# Patient Record
Sex: Female | Born: 1970
Health system: Southern US, Community
[De-identification: ages and names within clinical notes are randomized; demographics above are authoritative.]

## PROBLEM LIST (undated history)

## (undated) DIAGNOSIS — G43909 Migraine, unspecified, not intractable, without status migrainosus: Secondary | ICD-10-CM

## (undated) DIAGNOSIS — L309 Dermatitis, unspecified: Secondary | ICD-10-CM

## (undated) DIAGNOSIS — T7840XA Allergy, unspecified, initial encounter: Secondary | ICD-10-CM

## (undated) DIAGNOSIS — K219 Gastro-esophageal reflux disease without esophagitis: Secondary | ICD-10-CM

## (undated) DIAGNOSIS — J45909 Unspecified asthma, uncomplicated: Secondary | ICD-10-CM

## (undated) HISTORY — DX: Unspecified asthma, uncomplicated: J45.909

## (undated) HISTORY — DX: Allergy, unspecified, initial encounter: T78.40XA

## (undated) HISTORY — DX: Gastro-esophageal reflux disease without esophagitis: K21.9

## (undated) HISTORY — DX: Dermatitis, unspecified: L30.9

## (undated) HISTORY — PX: FOOT SURGERY: SHX648

## (undated) HISTORY — DX: Migraine, unspecified, not intractable, without status migrainosus: G43.909

## (undated) HISTORY — PX: HERNIA REPAIR: SHX51

## (undated) HISTORY — PX: TUBAL LIGATION: SHX77

---

## 1998-02-16 ENCOUNTER — Inpatient Hospital Stay (HOSPITAL_COMMUNITY): Admission: AD | Admit: 1998-02-16 | Discharge: 1998-02-18 | Payer: Self-pay | Admitting: Obstetrics & Gynecology

## 1998-06-03 ENCOUNTER — Other Ambulatory Visit: Admission: RE | Admit: 1998-06-03 | Discharge: 1998-06-03 | Payer: Self-pay | Admitting: Obstetrics & Gynecology

## 1999-05-10 ENCOUNTER — Other Ambulatory Visit: Admission: RE | Admit: 1999-05-10 | Discharge: 1999-05-10 | Payer: Self-pay | Admitting: Obstetrics and Gynecology

## 1999-08-23 ENCOUNTER — Other Ambulatory Visit: Admission: RE | Admit: 1999-08-23 | Discharge: 1999-08-23 | Payer: Self-pay | Admitting: Obstetrics & Gynecology

## 2000-05-29 ENCOUNTER — Other Ambulatory Visit: Admission: RE | Admit: 2000-05-29 | Discharge: 2000-05-29 | Payer: Self-pay | Admitting: *Deleted

## 2000-05-29 ENCOUNTER — Encounter (INDEPENDENT_AMBULATORY_CARE_PROVIDER_SITE_OTHER): Payer: Self-pay

## 2001-05-23 ENCOUNTER — Other Ambulatory Visit: Admission: RE | Admit: 2001-05-23 | Discharge: 2001-05-23 | Payer: Self-pay | Admitting: Obstetrics and Gynecology

## 2001-06-26 ENCOUNTER — Ambulatory Visit (HOSPITAL_COMMUNITY): Admission: RE | Admit: 2001-06-26 | Discharge: 2001-06-26 | Payer: Self-pay | Admitting: Surgery

## 2002-02-06 ENCOUNTER — Other Ambulatory Visit: Admission: RE | Admit: 2002-02-06 | Discharge: 2002-02-06 | Payer: Self-pay | Admitting: *Deleted

## 2002-04-10 ENCOUNTER — Inpatient Hospital Stay (HOSPITAL_COMMUNITY): Admission: AD | Admit: 2002-04-10 | Discharge: 2002-04-12 | Payer: Self-pay | Admitting: *Deleted

## 2002-08-15 ENCOUNTER — Other Ambulatory Visit: Admission: RE | Admit: 2002-08-15 | Discharge: 2002-08-15 | Payer: Self-pay | Admitting: *Deleted

## 2004-03-11 ENCOUNTER — Other Ambulatory Visit: Admission: RE | Admit: 2004-03-11 | Discharge: 2004-03-11 | Payer: Self-pay | Admitting: Obstetrics and Gynecology

## 2004-12-08 ENCOUNTER — Encounter: Admission: RE | Admit: 2004-12-08 | Discharge: 2004-12-08 | Payer: Self-pay | Admitting: Emergency Medicine

## 2005-05-23 ENCOUNTER — Other Ambulatory Visit: Admission: RE | Admit: 2005-05-23 | Discharge: 2005-05-23 | Payer: Self-pay | Admitting: Obstetrics and Gynecology

## 2007-02-14 ENCOUNTER — Inpatient Hospital Stay (HOSPITAL_COMMUNITY): Admission: AD | Admit: 2007-02-14 | Discharge: 2007-02-14 | Payer: Self-pay | Admitting: Family Medicine

## 2007-02-26 ENCOUNTER — Inpatient Hospital Stay (HOSPITAL_COMMUNITY): Admission: AD | Admit: 2007-02-26 | Discharge: 2007-03-01 | Payer: Self-pay | Admitting: Obstetrics and Gynecology

## 2007-02-27 ENCOUNTER — Encounter (INDEPENDENT_AMBULATORY_CARE_PROVIDER_SITE_OTHER): Payer: Self-pay | Admitting: Obstetrics and Gynecology

## 2008-07-31 ENCOUNTER — Encounter: Admission: RE | Admit: 2008-07-31 | Discharge: 2008-07-31 | Payer: Self-pay | Admitting: Family Medicine

## 2010-06-22 NOTE — Op Note (Signed)
NAME:  Stacey Hart, Stacey Hart NO.:  [id number]   MEDICAL RECORD NO.:  [id number]          PATIENT TYPE:  INP   LOCATION:  9110                          FACILITY:  [facility]   PHYSICIAN:  Osborn Coho, M.D.   DATE OF BIRTH:  15-Apr-1970   DATE OF PROCEDURE:  02/27/2007  DATE OF DISCHARGE:                               OPERATIVE REPORT   PREOPERATIVE DIAGNOSIS:  Desires permanent sterilization.   POSTOPERATIVE DIAGNOSIS:  Desires permanent sterilization.   PROCEDURE:  Postpartum bilateral tubal ligation.   ANESTHESIA:  Epidural.   ATTENDING:  Osborn Coho, M.D.   FLUIDS:  1100 mL.   URINE OUTPUT:  The patient voided prior to procedure.   ESTIMATED BLOOD LOSS:  Minimal.   COMPLICATIONS:  None.   FINDINGS:  Normal appearing bilateral fallopian tubes.   SPECIMENS TO PATHOLOGY:  Portions of bilateral fallopian tubes.   PROCEDURE IN DETAIL:  The patient was taken to the operating room after  the risks, benefits, and alternatives were reviewed with the patient.  The patient verbalized understanding and consent signed and witnessed.  The patient was given a surgical level via the epidural and prepped and  draped in a normal sterile fashion in the supine position.  A 10 mm  incision was made at the umbilicus and carried down to the underlying  layer of fascia with the Metzenbaum scissors.  The peritoneum was  entered with a hemostat.  [employer] retractors were replaced. The bowel  was noted to be fairly distended and the patient was placed in  Trendelenburg.  A narrow laparotomy sponge was placed to help move the  bowel.  The right fallopian tube was grasped in its mid portion with the  Babcock after carrying it out to its fimbriated end and was ligated  twice with 0 plain ties.  The tube was excised and the remaining pedicle  cauterized with the Bovie.  The same was done with the left fallopian  tube after carrying it out to its fimbriated end.  There was some  bleeding noted which appeared to be coming from the initial incision.  The abdomen was observed for a few minutes to see if any more bleeding  was noted and there was no more bleeding noted.  The the patient was  taken out of Trendelenburg and the fascia was repaired with 0 Vicryl in  a running  fashion.  The skin was reapproximated using 3-0 Monocryl via a  subcuticular stitch.  Sponge, lap and needle counts were correct.  The  patient tolerated the procedure well and was awaiting transfer to the  recovery room in good condition.      Osborn Coho, M.D.  Electronically Signed     AR/MEDQ  D:  02/27/2007  T:  02/27/2007  Job:  [id number]

## 2010-06-22 NOTE — H&P (Signed)
[name], Hart NO.:  [id number]   MEDICAL RECORD NO.:  [id number]          PATIENT TYPE:  INP   LOCATION:  9110                          FACILITY:  [facility]   PHYSICIAN:  Stacey Hart, M.D. DATE OF BIRTH:  August 17, 1970   DATE OF ADMISSION:  02/26/2007  DATE OF DISCHARGE:                              HISTORY & PHYSICAL   HISTORY OF PRESENT ILLNESS:  This is a 40 year old gravida 4, para 3, 0-  0-3 at 39-2/7 weeks who presents with contractions every 3 minutes and  leaking fluid since 9:00 p.m.  She reports positive fetal movement.  Her  pregnancy has been followed by the MD service and is remarkable for:  1. HSV 2.  2. Desires bilateral tubal ligation.  3. Umbilical hernia repair in 2003.  4. History of retained placenta.  5. Irritable bowel syndrome.  6. Latex sensitivity.  7. Anti-E antibody.  8. Group B strep negative.   ALLERGIES:  LATEX CAUSES BURNING AND ITCHING AND SHE IS ALSO ALLERGIC TO  PEANUTS.   OBSTETRICAL HISTORY:  Remarkable for vaginal delivery in 1996 of a  female at [redacted] weeks gestation weighing 7 pounds remarkable for a retained  placenta.  She had a vaginal delivery in 2000 of a female at [redacted] weeks  gestation weighing 7 pounds, 1 ounce with no complications.  She had a  vaginal delivery in 2004 of a female infant at [redacted] weeks gestation weighing  7 pounds, 14 ounces with no complications.   MEDICAL HISTORY:  Remarkable for a history of anemia, abnormal Pap, HSV  2, childhood varicella, varicose veins, irritable bowel syndrome.   SURGICAL HISTORY:  Remarkable for a umbilical hernia repair in 2003.   FAMILY HISTORY:  Remarkable for a grandfather with diabetes, uncle with  kidney failure, grandmother with stroke, aunt with lupus, grandfather  with pancreatic cancer, uncle with bone cancer and throat cancer.   GENETIC HISTORY:  Negative except for the patient's age of 71.   SOCIAL HISTORY:  The patient is married.  The father of the baby,  Stacey Hart, is involved and supportive.  She does not report a  religious affiliation.  She denies any alcohol, tobacco, or drug use.   PRENATAL LABORATORIES:  Hemoglobin 10.9, platelets 183,000, blood type  B+, antibody screen remarkable for anti-E antibodies, titers were 1-4 in  November and 1-8 in December, sickle cell negative, RPR nonreactive,  rubella immune, hepatitis negative, HIV negative, Pap test normal, TSH  normal.   HISTORY OF CURRENT PREGNANCY:  The patient entered care at [redacted] weeks  gestation.  She had an ultrasound to confirm dates.  First trimester  screen was normal.  Opera tive report was reviewed from her hernia  repair.  No mesh was used for repair per operative note.  An ultrasound  at 18 weeks was normal.  AFP was normal.  She had some heart  palpitations and was seen by a cardiologist with a normal examination.  She had some spotting at 24 weeks which resolved.  Glucola was normal.  Antibody-E  titer at 30 weeks was 1-4 and at 36 weeks was  1-8.  Group B  strep was negative at term.   OBJECTIVE DATA:  VITAL SIGNS:  Stable, afebrile.  HEENT: Within normal limits.  NECK:  Thyroid normal, not enlarged.  CHEST: Clear to auscultation.  HEART: Regular rate and rhythm.  ABDOMEN: Gravid, vertex, Leopold's exam shows fetal heart rate 150s with  accelerations to 160-175, minimal variability right now, and uterine  contractions every 1-3 minutes.  PELVIC:  Cervix is 4 cm, 80% effaced, -1 station with vertex  presentation.  There is blood-tinged clear fluid with positive pulling.  No HSV lesions or prodrome are present.  EXTREMITIES: Within normal  limits.   ASSESSMENT:  1. Intrauterine pregnancy at 39-2/7 weeks.  2. Active labor.  3. Spontaneous rupture of membranes.   PLAN:  1. Admit to Dr. Estanislado Hart.  2. Routine doctor orders.  3. IV Stadol and then epidural.      Stacey Hart, C.N.M.      Stacey Fat Hart, M.D.  Electronically Signed     MLW/MEDQ  D:  02/26/2007  T:  02/27/2007  Job:  [id number]

## 2010-06-25 NOTE — Op Note (Signed)
[facility]  Patient:    [name], [name] Visit Number: [id number] MRN: [id number]          Service Type: DSU Location: DAY Attending Physician:  Shelly Rubenstein Dictated by:   Abigail Miyamoto, M.D. Proc. Date: 06/26/01 Admit Date:  06/26/2001                             Operative Report  PREOPERATIVE DIAGNOSIS:  Umbilical hernia.  POSTOPERATIVE DIAGNOSIS:  Umbilical hernia.  OPERATION PERFORMED:  Umbilical hernia repair.  SURGEON:  Abigail Miyamoto, M.D.  ANESTHESIA:  LMA and 1% lidocaine and 0.25% Marcaine.  ESTIMATED BLOOD LOSS:  Minimal.  DESCRIPTION OF PROCEDURE:  Patient brought to operating room and identified as Stacey Hart.  She was placed supine on the operating table and general anesthesia was induced.  Her abdomen was then prepped and draped in the usual sterile fashion.  The skin overlying the umbilicus was then anesthetized with 1% lidocaine.  A small transverse incision was made above the umbilicus over the palpable hernia.  The incision was carried down through the small hernia defect which was easily identified.  The redundant fat coming out of the small less than 1 cm defect was transected with the electrocautery.  The fascial edges were identified circumferentially.  The fascia was then closed with several figure-of-eight 0 Ethibond sutures.  The wound was then irrigated with normal saline.  The fascia was anesthetized with 0.25% Marcaine.  The subcutaneous layer was closed with interrupted 3-0 Vicryl suture.  The skin was closed with a running 4-0 Monocryl.  Steri-Strips, gauze and tape were then applied.  The patient tolerated the procedure well.  All, sponge, needle and instrument counts were correct at the end of the procedure.  The patient was then extubated in the operating room and taken in stable condition to the recovery room. Dictated by:   Abigail Miyamoto, M.D. Attending Physician:  Shelly Rubenstein DD:  06/26/01 TD:  06/27/01 Job: [id number]

## 2010-06-25 NOTE — Discharge Summary (Signed)
[name], [name], M.D.DATE OF BIRTH:  October 20, 1970   DATE OF ADMISSION:  02/26/2007  DATE OF DISCHARGE:  03/01/2007                               DISCHARGE SUMMARY   ADMISSION DIAGNOSES:  1. Intrauterine pregnancy at 39-2/7 weeks.  2. Active labor.  3. Spontaneous rupture of membranes.   DISCHARGE DIAGNOSES:  1. Intrauterine pregnancy at term.  2. Status post vaginal delivery.  3. Status post bilateral tubal ligation.  4. Mild thrombocytopenia.  5. Breast feeding.   PROCEDURES:  1. Bilateral tubal ligation.  2. Epidural anesthesia.   HOSPITAL COURSE:  Stacey Hart is a 40 year old gravida 4, para 3-0-0-3 at  39-2/7 weeks, who presented with contractions every 3 minutes on February 26, 2007 and leakage of fluid since 9 p.m.  Her pregnancy had been  followed by MD service and was remarkable for HSV2, desiring bilateral  tubal ligation, umbilical hernia repair in 2003, history of retained  placenta, irritable bowel syndrome, latex sensitivity, anti-E antibody,  group beta strep negative.   On admission, the patient's cervix was 4 cm dilated, 80% effaced, -1  station, vertex.  She did have blood-tinged, however, clear fluid with  positive pooling.  There were no HSV lesions noted.  The patient denied  any prodromal symptoms.  She was admitted to a birthing suite.  She  received IV Stadol and epidural p.r.n.  Just after midnight on February 27, 2007, the patient was status post epidural and was comfortable.  Cervix was 5 cm, 90% vertex and -1.  Fetal heart rate was reassuring.  Approximately 1:48 in the morning on February 27, 2007, the patient was  comfortable, but was feeling some pressure.  Her cervix was 6-7, 90%, -  1.  There was some molding.  Fetal heart rate was reassuring.  Contractions were every 3-4 minutes and were mild-moderate.  Plan was  made to start pitocin per protocol IV for labor augmentation.  At 3  a.m., the patient was complete and pushed to SVD of a viable female by  the name of Lauren at 3:14 with Apgars 9 and 9 and weight 7 pounds 3  ounces.  She delivered over an intact perineum.  EBL was approximately  300 mL and placenta delivered intact spontaneously.   Later that morning which was postpartum day zero, the patient was seen  by nurse midwife.  She was doing well and was up ad lib.  Her tubal was  scheduled for 1 p.m. in the afternoon.  She was breast feeding.  She was  afebrile.  Vital signs were stable.  Fundus was firm.  She had scant  lochia.  Extremities were within normal limits.  Later on that afternoon  on postpartum day zero, the patient was taken to OR for a bilateral  tubal ligation per Dr. Osborn Coho.  The patient prior to  sterilization had been discussed risks, benefits, and alternatives of  the procedure and  did desire to proceed with bilateral tubal ligation.  The patient was sent to the recovery room in good condition following  her procedure.  By postpartum day number 1 which was also postop day  number 1, the patient was feeling okay, however, she was feeling rather  moderate gas pains status post the procedure.  She had no bowel movement  in several days, but was tolerating a regular diet and had positive  flatulence.  Her vital signs remained stable.  Her hemoglobin was 10.6  down from 11.1.  Chest was clear.  Her abdomen was soft, nontender with  the exception of some gas.  Fundus was firm and nontender.  Extremities  were within normal limits.  The patient was begun on simethicone and  Senokot that day as well as Colace b.i.d.  CBC was planned to be  repeated on March 01, 2007 secondary to low platelets.  On admission,  they were 124 and on postoperative day number 1, they were down to 107.  Postpartum day  number 2, which was also postop day number 2, the patient  was doing better, still some gas pains, but was using a heating pad and  had started the extra stool softeners, and antigas medications.  She was  ready to go home.  She had positive flatulence.  She was tolerating a  regular diet and voiding without difficulty.  She was having moderate  periumbilical pain and reported decrease in lochia.   On postpartum number 2, platelets had improved.  They were up to 128  from 107 the previous day.  White count remained stable.  It was 6.4 and  had been 7.4 the day before.  Hemoglobin was back up to 11.1 which was  what she started out with on admission.  Her physical examination was  within normal limits.  Abdomen was slightly distended, however, fundus  was firm.  Some tenderness around her umbilical incision, however, it  was intact.  She did not have any dressing or drainage noted.  It was  open to air and nontender.  She did have some generalized edema in her  lower extremities, but negative Homan's.  The patient was deemed to have  received full benefit of her hospital stay and was discharged home in  good condition.   DISCHARGE INSTRUCTIONS:  1. Per [employer].  2. Ambulation was encouraged.  3. Heating pad p.r.n. for gas pain.  4. At that time, plan was made to repeat her platelet count at 6 weeks      postpartum.   DISCHARGE MEDICATIONS:  1. Motrin 600 mg p.o. q.6 h. p.r.n. pain.  2. Darvocet-N 100 1 tablet p.o. q.4 h. p.r.n. pain.  3. The patient was also encouraged to remain on a daily vitamin and      stool softener as needed.   FOLLOW UP:  Discharge followup was to occur in 6 weeks or p.r.n.      Candice Denny Levy, [place]      Janine Limbo, M.D.  Electronically Signed    CHS/MEDQ  D:  03/03/2007  T:  03/03/2007  Job:  [id number]

## 2010-07-29 IMAGING — US US PELVIS COMPLETE
1 series · 14 of 25 positions shown · non-contrast
Comparison: None

CLINICAL DATA: Pelvic pain, menorrhagia.

TRANSABDOMINAL AND TRANSVAGINAL ULTRASOUND OF PELVIS
TECHNIQUE: Both transabdominal and transvaginal ultrasound
examinations of the pelvis were performed including evaluation of
the uterus, ovaries, adnexal regions, and pelvic cul-de-sac.

[Series 1: us pelvis complete · 0.28mm/px · 14 of 72 slices shown]
[im 1/72]
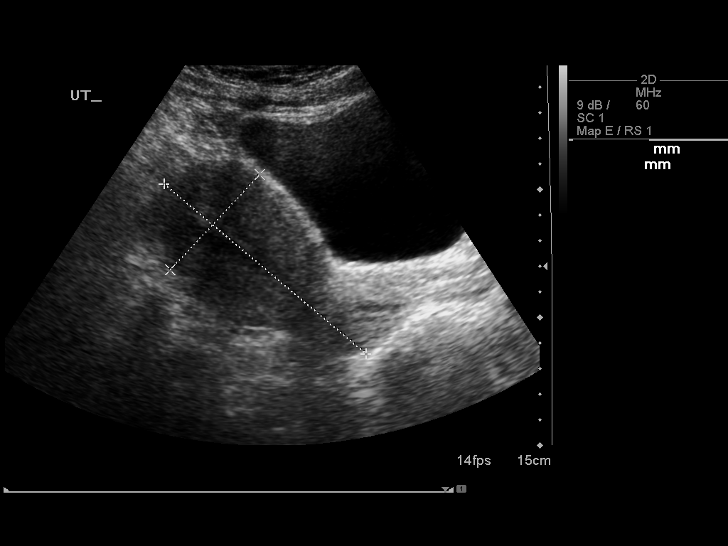
[im 6/72]
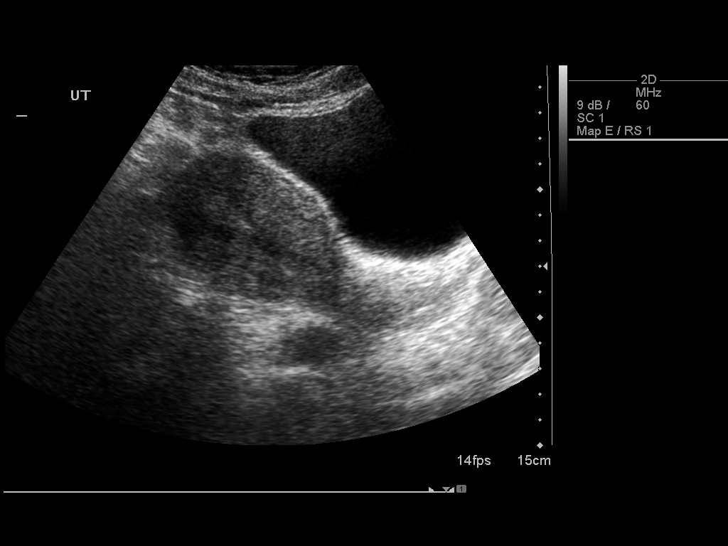
[im 12/72]
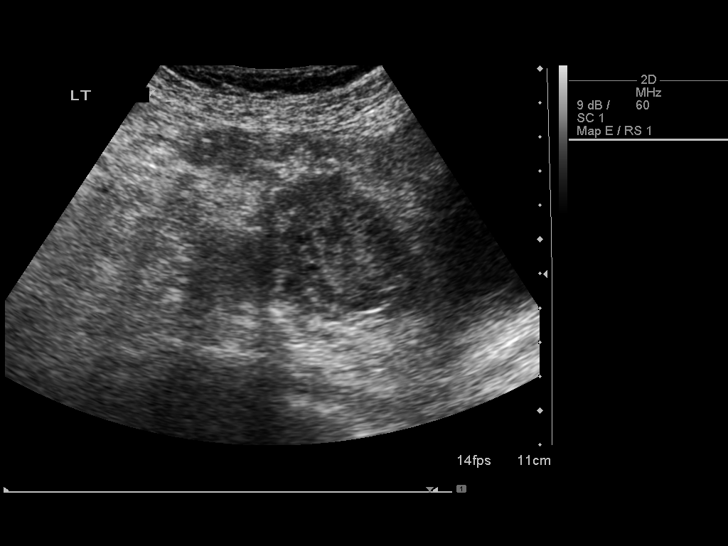
[im 18/72]
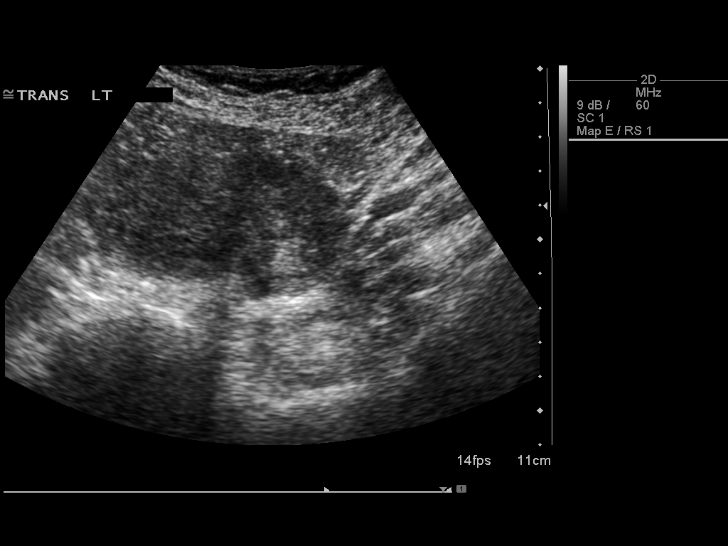
[im 24/72]
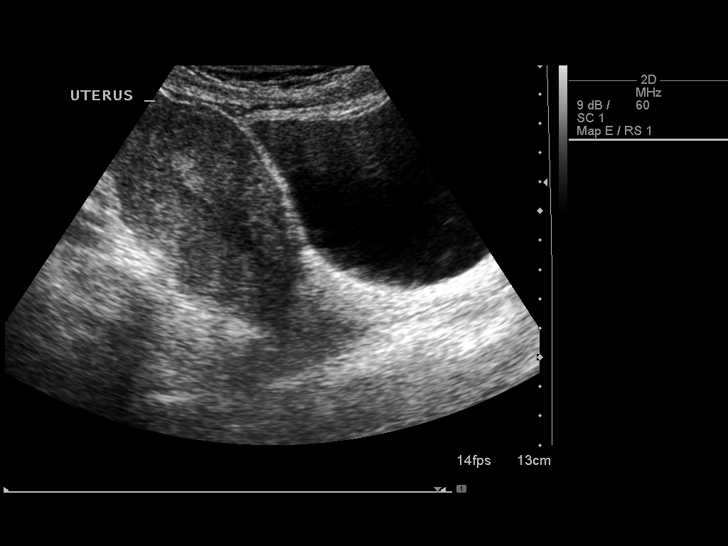
[im 27/72]
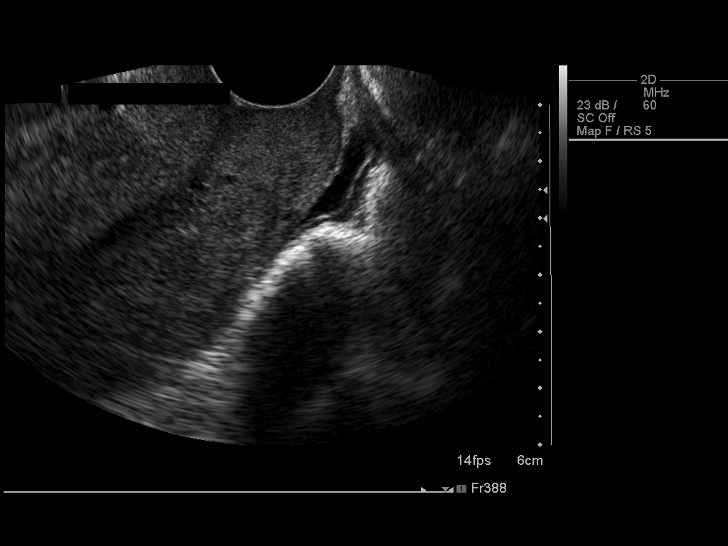
[im 33/72]
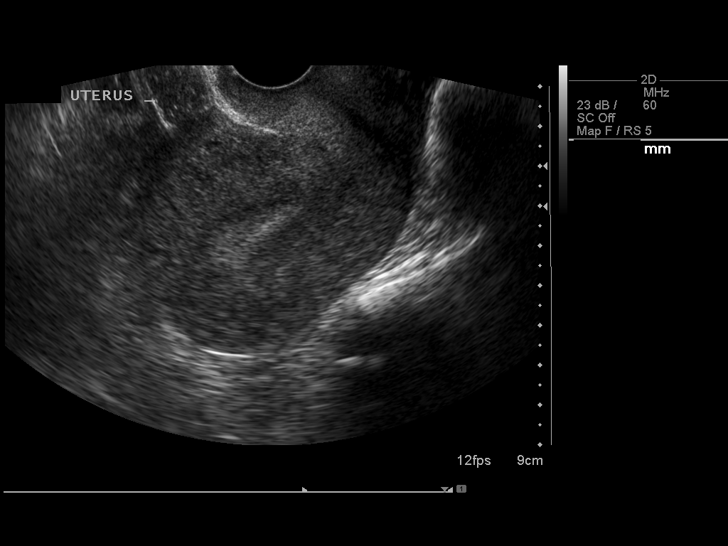
[im 39/72]
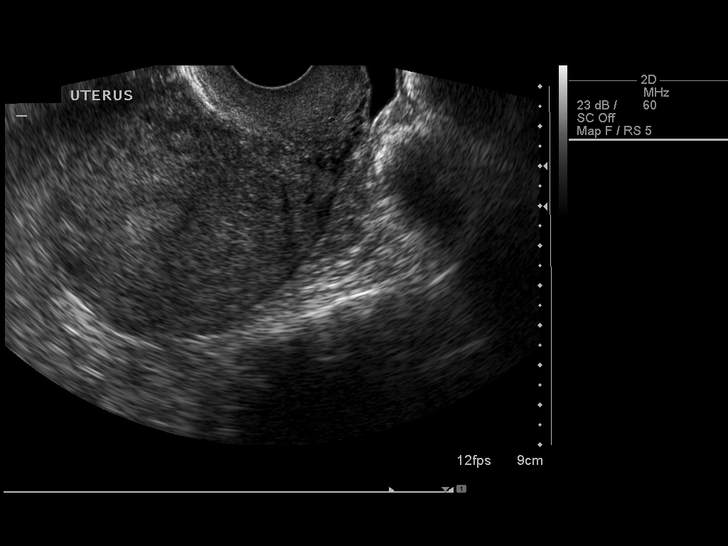
[im 45/72]
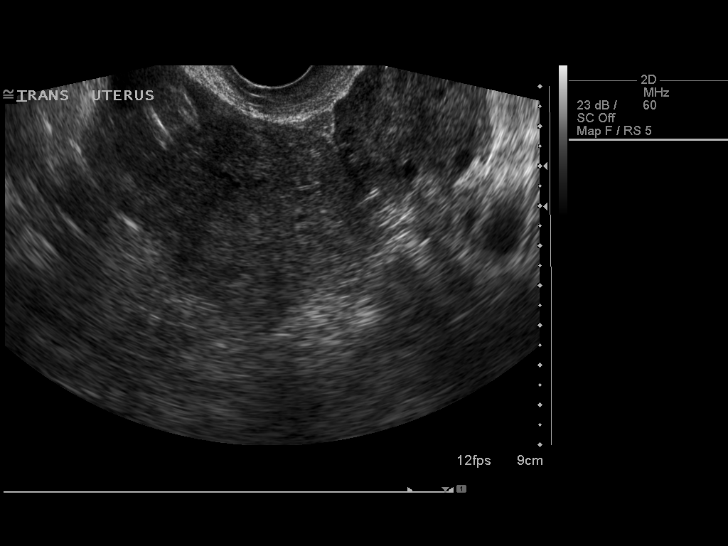
[im 48/72]
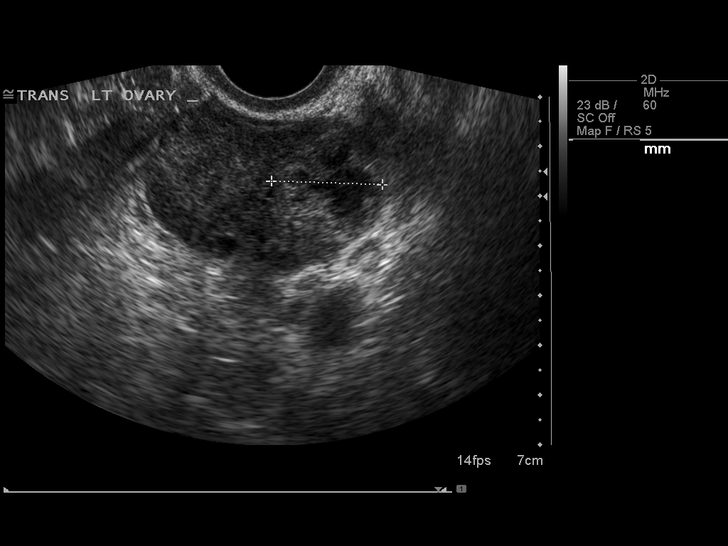
[im 54/72]
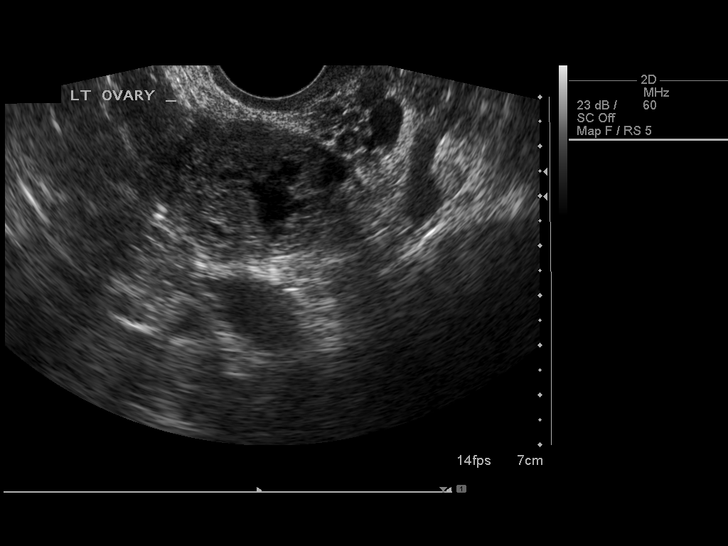
[im 60/72]
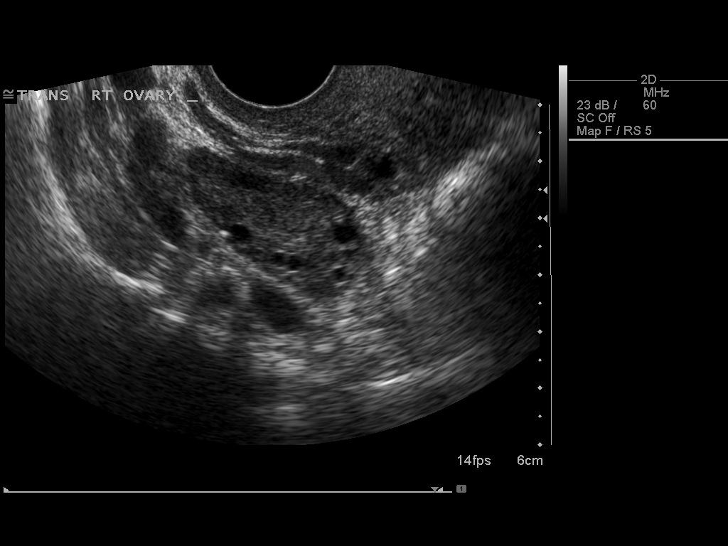
[im 66/72]
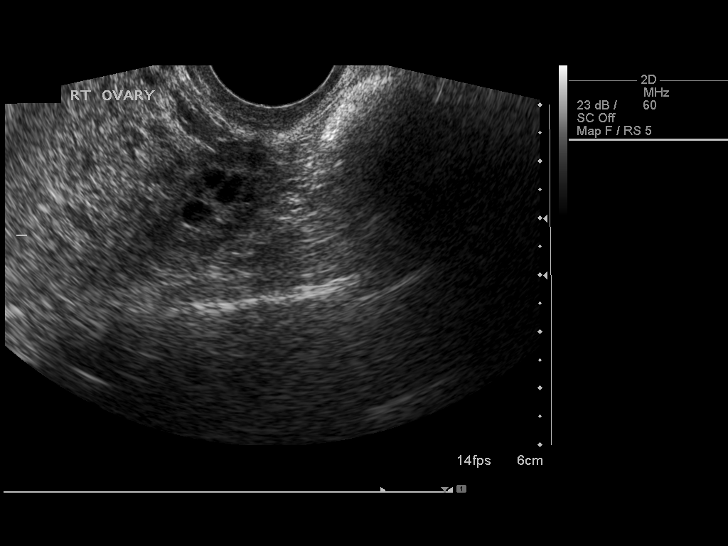
[im 72/72]
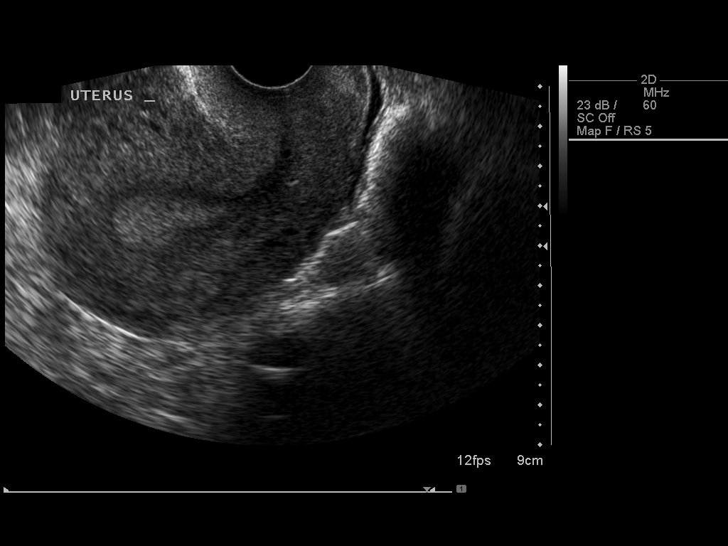

[14 of 25 positions shown; findings below may reference images not displayed]

FINDINGS: Uterus:  Uterus measures 10.3 x 5.1 x 7.0 cm.  No focal uterine
abnormality.

Endometrium:  Normal, 15 mm.

Right Ovary:  Normal size and echotexture.  No adnexal masses.
Multiple small follicles.

Left Ovary:  Small resolving corpus luteal cyst, 2.2 cm.

Other Findings:  Small amount of free fluid in the pelvis.
IMPRESSION: Small amount of free fluid, likely physiologic.

Unremarkable pelvic ultrasound.

## 2010-10-28 LAB — CBC
HCT: 31.8 — ABNORMAL LOW
HCT: 32.7 — ABNORMAL LOW
HCT: 33.5 — ABNORMAL LOW
Hemoglobin: 10.6 — ABNORMAL LOW
Hemoglobin: 11.1 — ABNORMAL LOW
Hemoglobin: 11.1 — ABNORMAL LOW
MCHC: 33.3
MCHC: 33.5
MCHC: 33.9
MCV: 85.4
MCV: 85.7
MCV: 85.9
Platelets: 107 — ABNORMAL LOW
Platelets: 124 — ABNORMAL LOW
Platelets: 128 — ABNORMAL LOW
RBC: 3.71 — ABNORMAL LOW
RBC: 3.83 — ABNORMAL LOW
RBC: 3.9
RDW: 13.6
RDW: 13.9
RDW: 14.1
WBC: 6.4
WBC: 6.4
WBC: 7.4

## 2010-10-28 LAB — RPR: RPR Ser Ql: NONREACTIVE

## 2012-05-08 DIAGNOSIS — Z0271 Encounter for disability determination: Secondary | ICD-10-CM

## 2012-07-23 ENCOUNTER — Ambulatory Visit (INDEPENDENT_AMBULATORY_CARE_PROVIDER_SITE_OTHER): Payer: 59 | Admitting: Family Medicine

## 2012-07-23 ENCOUNTER — Encounter: Payer: Self-pay | Admitting: Family Medicine

## 2012-07-23 VITALS — BP 104/69 | HR 83 | Temp 98.6°F | Resp 16 | Ht 68.5 in | Wt 179.0 lb

## 2012-07-23 DIAGNOSIS — Z0000 Encounter for general adult medical examination without abnormal findings: Secondary | ICD-10-CM

## 2012-07-23 DIAGNOSIS — R143 Flatulence: Secondary | ICD-10-CM

## 2012-07-23 DIAGNOSIS — R140 Abdominal distension (gaseous): Secondary | ICD-10-CM

## 2012-07-23 LAB — CBC WITH DIFFERENTIAL/PLATELET
Basophils Absolute: 0 10*3/uL (ref 0.0–0.1)
Basophils Relative: 1 % (ref 0–1)
Eosinophils Absolute: 0 10*3/uL (ref 0.0–0.7)
HCT: 36.9 % (ref 36.0–46.0)
Hemoglobin: 13 g/dL (ref 12.0–15.0)
Lymphocytes Relative: 35 % (ref 12–46)
Lymphs Abs: 1.4 10*3/uL (ref 0.7–4.0)
MCHC: 35.2 g/dL (ref 30.0–36.0)
MCV: 85.2 fL (ref 78.0–100.0)
Monocytes Absolute: 0.4 10*3/uL (ref 0.1–1.0)
Neutro Abs: 2.1 10*3/uL (ref 1.7–7.7)
Platelets: 260 10*3/uL (ref 150–400)
RBC: 4.33 MIL/uL (ref 3.87–5.11)
RDW: 13.1 % (ref 11.5–15.5)
WBC: 4 10*3/uL (ref 4.0–10.5)

## 2012-07-23 NOTE — Patient Instructions (Signed)
Food diary to see if identifiable cause of bloating. recheck in next 1-2 months if not improving. Return to the clinic or go to the nearest emergency room if any of your symptoms worsen or new symptoms occur. We will refer you for a mammogram.  You should receive a call or letter about your lab results within the next week to 10 days.  Plan on recheck labia cyst and pap testing at your convenience.  \Keeping You Healthy  Get These Tests 1. Blood Pressure- Have your blood pressure checked once a year by your health care provider.  Normal blood pressure is 120/80. 2. Weight- Have your body mass index (BMI) calculated to screen for obesity.  BMI is measure of body fat based on height and weight.  You can also calculate your own BMI at https://www.west-esparza.com/. 3. Cholesterol- Have your cholesterol checked every 5 years starting at age 56 then yearly starting at age 60. 4. Chlamydia, HIV, and other sexually transmitted diseases- Get screened every year until age 58, then within three months of each new sexual provider. 5. Pap Smear- Every 1-3 years; discuss with your health care provider. 6. Mammogram- Every year starting at age 40  Take these medicines  Calcium with Vitamin D-Your body needs 1200 mg of Calcium each day and [id number] IU of Vitamin D daily.  Your body can only absorb 500 mg of Calcium at a time so Calcium must be taken in 2 or 3 divided doses throughout the day.  Multivitamin with folic acid- Once daily if it is possible for you to become pregnant.  Get these Immunizations  Gardasil-Series of three doses; prevents HPV related illness such as genital warts and cervical cancer.  Menactra-Single dose; prevents meningitis.  Tetanus shot- Every 10 years.  Flu shot-Every year.  Take these steps 1. Do not smoke-Your healthcare provider can help you quit.  For tips on how to quit go to www.smokefree.gov or call 1-800 QUITNOW. 2. Be physically active- Exercise 5 days a week for at  least 30 minutes.  If you are not already physically active, start slow and gradually work up to 30 minutes of moderate physical activity.  Examples of moderate activity include walking briskly, dancing, swimming, bicycling, etc. 3. Breast Cancer- A self breast exam every month is important for early detection of breast cancer.  For more information and instruction on self breast exams, ask your healthcare provider or [place]. 4. Eat a healthy diet- Eat a variety of healthy foods such as fruits, vegetables, whole grains, low fat milk, low fat cheeses, yogurt, lean meats, poultry and fish, beans, nuts, tofu, etc.  For more information go to www. Thenutritionsource.org 5. Drink alcohol in moderation- Limit alcohol intake to one drink or less per day. Never drink and drive. 6. Depression- Your emotional health is as important as your physical health.  If you're feeling down or losing interest in things you normally enjoy please talk to your healthcare provider about being screened for depression. 7. Dental visit- Brush and floss your teeth twice daily; visit your dentist twice a year. 8. Eye doctor- Get an eye exam at least every 2 years. 9. Helmet use- Always wear a helmet when riding a bicycle, motorcycle, rollerblading or skateboarding. 10. Safe sex- If you may be exposed to sexually transmitted infections, use a condom. 11. Seat belts- Seat belts can save your live; always wear one. 12. Smoke/Carbon Monoxide detectors- These detectors need to be installed on the appropriate level of your home. Replace batteries  at least once a year. 13. Skin cancer- When out in the sun please cover up and use sunscreen 15 SPF or higher. 14. Violence- If anyone is threatening or hurting you, please tell your healthcare provider.

## 2012-07-23 NOTE — Progress Notes (Signed)
Subjective:    Patient ID: Stacey Hart, female    DOB: May 04, 1970, 42 y.o.   MRN: [id number]  HPI Stacey Hart is a 42 y.o. female CPE - No hx of mammogram.  No FH of breast cancer.  Lat pap smear - July 2012. Normal then, and normal prior to that. Denies sti's in past. Married and no new partners.  Would like sti testing - plans on chlamydia and gonorrhea testing when has pap testing.  Fasting today since last night.  Last tdap in 2011.   Has regular optho and dentist follow up.   Still has labial cyst - no change is size from 2012.  Plans to rtc as on menses today.  Declined pap today.   Bloating at times. Notes most days, past 6 months.  No abd pain. No N/v. Min belching/flatus - no change in amount. No change in pant size, and improves as day goes on. BM QD, no diarrhea. Off and on constipation. No current fiber supplements. Gas and cramping with dairy at times.    Review of Systems  Gastrointestinal: Positive for constipation and abdominal distention (bloating as above. ). Negative for nausea, vomiting, blood in stool and anal bleeding.  13 point review of systems per patient health survey noted.  Negative other than as indicated on reviewed nursing note.      Objective:   Physical Exam  Vitals reviewed. Constitutional: She is oriented to person, place, and time. She appears well-developed and well-nourished.  HENT:  Head: Normocephalic and atraumatic.  Right Ear: External ear normal.  Left Ear: External ear normal.  Mouth/Throat: Oropharynx is clear and moist.  Eyes: Conjunctivae are normal. Pupils are equal, round, and reactive to light.  Neck: Normal range of motion. Neck supple. No thyromegaly present.  Cardiovascular: Normal rate, regular rhythm, normal heart sounds and intact distal pulses.   No murmur heard. Pulmonary/Chest: Effort normal and breath sounds normal. No respiratory distress. She has no wheezes. Right breast exhibits no inverted nipple, no  mass, no nipple discharge, no skin change and no tenderness. Left breast exhibits no inverted nipple, no mass, no nipple discharge, no skin change and no tenderness. Breasts are symmetrical.  Abdominal: Soft. Bowel sounds are normal. There is no tenderness.  Genitourinary:  Exam deferred today.   Musculoskeletal: Normal range of motion. She exhibits no edema and no tenderness.  Lymphadenopathy:    She has no cervical adenopathy.  Neurological: She is alert and oriented to person, place, and time.  Skin: Skin is warm and dry. No rash noted.  Psychiatric: She has a normal mood and affect. Her behavior is normal. Thought content normal.       Assessment & Plan:   Stacey Hart is a 42 y.o. female  PE (physical exam), annual - Plan: HIV antibody, RPR, Hepatitis B surface antigen, Hepatitis B surface antibody, Comprehensive metabolic panel, Lipid panel, TSH, CBC with Differential, MM Digital Screening referral. Anticipatory guidance below. check labs above. Plans on rtc to recheck cystic area on labia that has been reportedly stable. Can check PAP3 at that time as well.   Abdominal bloating - intermittent. NT on exam, no palpable swelling or distension appreciated.  Trial of increase water and slowly adapt increase in fiber. Food diary to determine if pattern.  ? Gluten vs dairy with cheese.  If not improving next 2 months - rtc for recheck, including pelvic with bimanual exam.   Patient Instructions  Food diary to see if identifiable cause of bloating.  recheck in next 1-2 months if not improving. Return to the clinic or go to the nearest emergency room if any of your symptoms worsen or new symptoms occur. We will refer you for a mammogram.  You should receive a call or letter about your lab results within the next week to 10 days.  Plan on recheck labia cyst and pap testing at your convenience.  \Keeping You Healthy  Get These Tests 1. Blood Pressure- Have your blood pressure checked  once a year by your health care provider.  Normal blood pressure is 120/80. 2. Weight- Have your body mass index (BMI) calculated to screen for obesity.  BMI is measure of body fat based on height and weight.  You can also calculate your own BMI at https://www.west-esparza.com/. 3. Cholesterol- Have your cholesterol checked every 5 years starting at age 17 then yearly starting at age 45. 4. Chlamydia, HIV, and other sexually transmitted diseases- Get screened every year until age 21, then within three months of each new sexual provider. 5. Pap Smear- Every 1-3 years; discuss with your health care provider. 6. Mammogram- Every year starting at age 60  Take these medicines  Calcium with Vitamin D-Your body needs 1200 mg of Calcium each day and [phone] IU of Vitamin D daily.  Your body can only absorb 500 mg of Calcium at a time so Calcium must be taken in 2 or 3 divided doses throughout the day.  Multivitamin with folic acid- Once daily if it is possible for you to become pregnant.  Get these Immunizations  Gardasil-Series of three doses; prevents HPV related illness such as genital warts and cervical cancer.  Menactra-Single dose; prevents meningitis.  Tetanus shot- Every 10 years.  Flu shot-Every year.  Take these steps 1. Do not smoke-Your healthcare provider can help you quit.  For tips on how to quit go to www.smokefree.gov or call 1-800 QUITNOW. 2. Be physically active- Exercise 5 days a week for at least 30 minutes.  If you are not already physically active, start slow and gradually work up to 30 minutes of moderate physical activity.  Examples of moderate activity include walking briskly, dancing, swimming, bicycling, etc. 3. Breast Cancer- A self breast exam every month is important for early detection of breast cancer.  For more information and instruction on self breast exams, ask your healthcare provider or [place]. 4. Eat a healthy diet- Eat a  variety of healthy foods such as fruits, vegetables, whole grains, low fat milk, low fat cheeses, yogurt, lean meats, poultry and fish, beans, nuts, tofu, etc.  For more information go to www. Thenutritionsource.org 5. Drink alcohol in moderation- Limit alcohol intake to one drink or less per day. Never drink and drive. 6. Depression- Your emotional health is as important as your physical health.  If you're feeling down or losing interest in things you normally enjoy please talk to your healthcare provider about being screened for depression. 7. Dental visit- Brush and floss your teeth twice daily; visit your dentist twice a year. 8. Eye doctor- Get an eye exam at least every 2 years. 9. Helmet use- Always wear a helmet when riding a bicycle, motorcycle, rollerblading or skateboarding. 10. Safe sex- If you may be exposed to sexually transmitted infections, use a condom. 11. Seat belts- Seat belts can save your live; always wear one. 12. Smoke/Carbon Monoxide detectors- These detectors need to be installed on the appropriate level of your home. Replace batteries at least once a year. 13. Skin cancer-  When out in the sun please cover up and use sunscreen 15 SPF or higher. 14. Violence- If anyone is threatening or hurting you, please tell your healthcare provider.

## 2012-07-24 LAB — COMPREHENSIVE METABOLIC PANEL
ALT: 14 U/L (ref 0–35)
AST: 15 U/L (ref 0–37)
Albumin: 4.5 g/dL (ref 3.5–5.2)
BUN: 14 mg/dL (ref 6–23)
CO2: 25 mEq/L (ref 19–32)
Calcium: 9.8 mg/dL (ref 8.4–10.5)
Chloride: 103 mEq/L (ref 96–112)
Creat: 0.65 mg/dL (ref 0.50–1.10)
Glucose, Bld: 87 mg/dL (ref 70–99)
Potassium: 4.3 mEq/L (ref 3.5–5.3)
Sodium: 137 mEq/L (ref 135–145)
Total Bilirubin: 0.6 mg/dL (ref 0.3–1.2)
Total Protein: 7.3 g/dL (ref 6.0–8.3)

## 2012-07-24 LAB — TSH: TSH: 0.733 u[IU]/mL (ref 0.350–4.500)

## 2012-07-24 LAB — LIPID PANEL
Cholesterol: 163 mg/dL (ref 0–200)
HDL: 48 mg/dL (ref >39)
LDL Cholesterol: 100 mg/dL — ABNORMAL HIGH (ref 0–99)
Total CHOL/HDL Ratio: 3.4 Ratio
VLDL: 15 mg/dL (ref 0–40)

## 2012-07-24 LAB — HEPATITIS B SURFACE ANTIGEN: Hepatitis B Surface Ag: NEGATIVE

## 2012-07-24 LAB — HEPATITIS B SURFACE ANTIBODY, QUANTITATIVE: Hep B S AB Quant (Post): 0 m[IU]/mL

## 2012-08-20 ENCOUNTER — Ambulatory Visit
Admission: RE | Admit: 2012-08-20 | Discharge: 2012-08-20 | Disposition: A | Discharge status: Home / Self Care | Payer: 59 | Source: Ambulatory Visit | Attending: Family Medicine | Admitting: Family Medicine

## 2012-08-20 ENCOUNTER — Encounter: Payer: Self-pay | Admitting: Family Medicine

## 2012-08-20 ENCOUNTER — Ambulatory Visit (INDEPENDENT_AMBULATORY_CARE_PROVIDER_SITE_OTHER): Payer: 59 | Admitting: Family Medicine

## 2012-08-20 VITALS — BP 116/74 | HR 89 | Temp 98.6°F | Resp 16 | Ht 68.5 in | Wt 183.5 lb

## 2012-08-20 DIAGNOSIS — N750 Cyst of Bartholin's gland: Secondary | ICD-10-CM

## 2012-08-20 DIAGNOSIS — Z0000 Encounter for general adult medical examination without abnormal findings: Secondary | ICD-10-CM

## 2012-08-20 DIAGNOSIS — Z9189 Other specified personal risk factors, not elsewhere classified: Secondary | ICD-10-CM

## 2012-08-20 NOTE — Progress Notes (Signed)
  Subjective:    Patient ID: Stacey Hart, female    DOB: 1970-03-05, 42 y.o.   MRN: [id number]  HPI Stacey Hart is a 42 y.o. female  See last CPE.  Hx of cystic area on labia in past.  Diagnosed in 2012 - suspected bartholin/labial cyst.  Last few weeks feels like only a little larger, but no pain - just a little larger. No change in past with warm soaks. No f/c, no discharge. Last pap normal July 2012 - normal. Last abnormal pap 10 years ago - colpo for ascus pap. No known CKC or LEEP.  currently on menses day 3 of usual 5 days.  Scant bleeding currently.  Had mammogram this am.   Review of Systems  Constitutional: Negative for fever and chills.  Genitourinary: Negative for genital sores, vaginal pain and menstrual problem.       Objective:   Physical Exam  Vitals reviewed. Constitutional: She is oriented to person, place, and time. She appears well-developed and well-nourished.  Pulmonary/Chest: Effort normal and breath sounds normal.  Abdominal: There is no tenderness.  Genitourinary:    There is no rash, tenderness, lesion or injury on the right labia. There is tenderness and lesion on the left labia. There is no rash or injury on the left labia. There is bleeding (cleared cervix with fox swab x 3 prior to pap testing without difficulty. ) around the vagina.  Neurological: She is alert and oriented to person, place, and time.  Skin: Skin is warm and dry.  Psychiatric: She has a normal mood and affect. Her behavior is normal.      Assessment & Plan:  Stacey Hart is a 42 y.o. female Pap smear, high-risk (screening, no prior abnormality) - Plan: Pap IG, CT/NG w/ reflex HPV when ASC-U. pap3 sent.   Bartholin cyst - Plan: Ambulatory referral to Gynecology given age of 29 to determine if cytology needed, or simple i/d given chronicity. Discussed does not appear to be abscess at present, but rtc precautions discussed.  Patient Instructions  We will refer you to  gynecologist for drainage or eval of the Bartholin's cyst.  You should receive a call or letter about your lab results within the next week to 10 days.

## 2012-08-20 NOTE — Patient Instructions (Signed)
We will refer you to gynecologist for drainage or eval of the Bartholin's cyst.  You should receive a call or letter about your lab results within the next week to 10 days.

## 2012-08-21 LAB — PAP IG, CT-NG, RFX HPV ASCU
Chlamydia Probe Amp: NEGATIVE
GC Probe Amp: NEGATIVE

## 2012-10-25 ENCOUNTER — Other Ambulatory Visit: Payer: Self-pay | Admitting: Obstetrics & Gynecology

## 2013-09-27 ENCOUNTER — Other Ambulatory Visit: Payer: Self-pay

## 2013-09-27 DIAGNOSIS — Z1231 Encounter for screening mammogram for malignant neoplasm of breast: Secondary | ICD-10-CM

## 2013-10-02 ENCOUNTER — Encounter (INDEPENDENT_AMBULATORY_CARE_PROVIDER_SITE_OTHER): Payer: Self-pay

## 2013-10-02 ENCOUNTER — Ambulatory Visit
Admission: RE | Admit: 2013-10-02 | Discharge: 2013-10-02 | Disposition: A | Discharge status: Home / Self Care | Payer: 59 | Source: Ambulatory Visit

## 2013-10-02 DIAGNOSIS — Z1231 Encounter for screening mammogram for malignant neoplasm of breast: Secondary | ICD-10-CM

## 2014-05-22 ENCOUNTER — Encounter: Payer: Self-pay | Admitting: Physician Assistant

## 2014-05-22 ENCOUNTER — Ambulatory Visit (INDEPENDENT_AMBULATORY_CARE_PROVIDER_SITE_OTHER): Payer: 59 | Admitting: Physician Assistant

## 2014-05-22 VITALS — BP 103/70 | HR 112 | Temp 98.4°F | Resp 16 | Ht 68.5 in | Wt 175.8 lb

## 2014-05-22 DIAGNOSIS — R200 Anesthesia of skin: Secondary | ICD-10-CM

## 2014-05-22 DIAGNOSIS — R208 Other disturbances of skin sensation: Secondary | ICD-10-CM

## 2014-05-22 DIAGNOSIS — Z130 Encounter for screening for diseases of the blood and blood-forming organs and certain disorders involving the immune mechanism: Secondary | ICD-10-CM | POA: Diagnosis not present

## 2014-05-22 DIAGNOSIS — Z1239 Encounter for other screening for malignant neoplasm of breast: Secondary | ICD-10-CM

## 2014-05-22 DIAGNOSIS — Z0000 Encounter for general adult medical examination without abnormal findings: Secondary | ICD-10-CM

## 2014-05-22 DIAGNOSIS — Z13220 Encounter for screening for lipoid disorders: Secondary | ICD-10-CM | POA: Diagnosis not present

## 2014-05-22 DIAGNOSIS — Z131 Encounter for screening for diabetes mellitus: Secondary | ICD-10-CM

## 2014-05-22 DIAGNOSIS — Z1329 Encounter for screening for other suspected endocrine disorder: Secondary | ICD-10-CM

## 2014-05-22 LAB — POCT GLYCOSYLATED HEMOGLOBIN (HGB A1C): Hemoglobin A1C: 5.3

## 2014-05-22 LAB — GLUCOSE, POCT (MANUAL RESULT ENTRY): POC Glucose: 90 mg/dl (ref 70–99)

## 2014-05-22 NOTE — Progress Notes (Signed)
   Subjective:    Patient ID: Stacey Hart, female    DOB: 1970-06-24, 44 y.o.   MRN: [id number]  HPI    Review of Systems  Constitutional: Negative.   HENT: Positive for postnasal drip, rhinorrhea, sinus pressure and sneezing.   Eyes: Positive for itching.  Respiratory: Negative.   Cardiovascular: Negative.   Gastrointestinal: Negative.   Endocrine: Negative.   Genitourinary: Negative.   Musculoskeletal: Negative.   Skin: Negative.   Allergic/Immunologic: Positive for environmental allergies.  Neurological: Negative.   Hematological: Negative.   Psychiatric/Behavioral: Negative.        Objective:   Physical Exam        Assessment & Plan:

## 2014-05-22 NOTE — Progress Notes (Signed)
Subjective:    Patient ID: Stacey Hart, female    DOB: 02-28-1970, 44 y.o.   MRN: [id number]  HPI Patient presents for complete physical with complaint of left 5th toe numbness. Also adds that she is now seeing a cardiologist with echo scheduled this month for cough and increased dizziness over the past 2 months related to the mitral valve prolapse that developed 7 years ago (2009) while she was pregnant. Denies current dizziness, HA, SOB, CP, cough, or edema. Has not been told she is experiencing heart failure.   Had bunionectomy of 5th toe in 2000 and has not had any problems up until 3 months ago. Has gradually experienced more numbness of toe, but denies pain, swelling, weakness, or color change. Feels like pins and needles or like toe has "fallen asleep". Numbness is worse in the morning, but feels better with rubbing of toe. Numbness does not radiate. Told she was pre-diabetic previously, but never on any medications for. States that she has improved diet, but is not dieting and has not lost any weight. Has been walking more often for exercise, but denies trauma, falls, or injury to foot/toe. Numbness does not affect gait.  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 States that she does not have a stressful life and is happy. Lives with husband and children. Has never smoked or used illicit drugs. Drinks alcohol socially. Not allergic to any medications, however is allergic to latex. Has seasonal allergies that are currently affecting her with sx of itchy eyes, sneezing, rhinorrhea, and sinus pressure.  Health maintence: Eye exam complete 2015. UTD on immunizations. Pap 2014 was normal. Mammogram 2015 was normal and is at higher risk as mother had breast cancer.   Review of Systems  Constitutional: Negative for fever, diaphoresis, activity change, appetite change and fatigue.  HENT: Positive for  congestion, postnasal drip, rhinorrhea, sinus pressure and sneezing. Negative for ear pain and sore throat.   Eyes: Positive for itching. Negative for photophobia, discharge and visual disturbance.  Respiratory: Negative.  Negative for cough, chest tightness, shortness of breath, wheezing and stridor.   Cardiovascular: Negative.  Negative for chest pain, palpitations and leg swelling.  Gastrointestinal: Negative.  Negative for nausea, vomiting, abdominal pain, diarrhea, constipation and blood in stool.  Genitourinary: Negative.  Negative for dysuria, frequency, hematuria, flank pain, vaginal bleeding, vaginal discharge, menstrual problem and dyspareunia.  Musculoskeletal: Negative for myalgias, joint swelling, arthralgias and gait problem.  Skin: Negative.  Negative for color change.  Allergic/Immunologic: Positive for environmental allergies.  Neurological: Positive for numbness. Negative for dizziness, weakness, light-headedness and headaches.  Hematological: Negative for adenopathy.  Psychiatric/Behavioral: Negative for behavioral problems, sleep disturbance, dysphoric mood and decreased concentration. The patient is not nervous/anxious.       Objective:   Physical Exam  Constitutional: She is oriented to person, place, and time. She appears well-developed and well-nourished. No distress.  Blood pressure 103/70, pulse 112, temperature 98.4 F (36.9 C), temperature source Oral, resp. rate 16, height 5' 8.5" (1.74 m), weight 175 lb 12.8 oz (79.742 kg), last menstrual period 05/09/2014, SpO2 98 %.  HENT:  Head: Normocephalic and atraumatic.  Right Ear: External ear normal.  Left Ear: External ear normal.  Nose: Nose normal.  Mouth/Throat: Oropharynx is clear and moist. No oropharyngeal exudate.  Eyes: Conjunctivae and EOM are normal. Pupils are equal, round, and reactive to light. Right eye exhibits  no discharge. Left eye exhibits no discharge. No scleral icterus.  Neck: Normal range of  motion. Neck supple. No JVD present. No thyromegaly present.  Cardiovascular: Normal rate, regular rhythm and intact distal pulses.  Exam reveals no gallop and no friction rub.   Murmur heard. Pulmonary/Chest: Effort normal and breath sounds normal. No respiratory distress. She has no wheezes. She has no rales. She exhibits no tenderness.  Abdominal: Soft. Bowel sounds are normal. She exhibits no distension and no mass. There is no tenderness. There is no rebound and no guarding.  Musculoskeletal: Normal range of motion. She exhibits no edema or tenderness.       Left foot: There is normal range of motion, no tenderness, no bony tenderness, no swelling, normal capillary refill, no crepitus and no deformity.       Feet:  Lymphadenopathy:    She has no cervical adenopathy.  Neurological: She is alert and oriented to person, place, and time. She has normal reflexes. She displays no atrophy. No cranial nerve deficit or sensory deficit. She exhibits normal muscle tone. Coordination normal.  Skin: Skin is warm and dry. No rash noted. She is not diaphoretic. No erythema. No pallor.  Psychiatric: Her behavior is normal. Judgment and thought content normal.   Results for orders placed or performed in visit on 05/22/14  POCT glucose (manual entry)  Result Value Ref Range   POC Glucose 90 70 - 99 mg/dl  POCT glycosylated hemoglobin (Hb A1C)  Result Value Ref Range   Hemoglobin A1C 5.3    Wt Readings from Last 3 Encounters:  05/22/14 175 lb 12.8 oz (79.742 kg)  08/20/12 183 lb 8 oz (83.235 kg)  07/23/12 179 lb (81.194 kg)      Assessment & Plan:  1. Annual physical exam Life style modifications discussed. Specifically surrounding diet.  2. Numbness of toes Likely related to previous surgery. If pain develops can send to either ortho or neuro. - POCT glucose (manual entry) - POCT glycosylated hemoglobin (Hb A1C)  3. Screening for cholesterol level - Lipid panel  4. Screening for diabetes  mellitus - POCT glycosylated hemoglobin (Hb A1C) - Comprehensive metabolic panel  5. Screening for deficiency anemia - CBC  6. Screening for thyroid disorder - TSH  7. Screening for breast cancer Higher risk as had mother with breast cancer. Genetic testing not done.  - Ambulatory referral to [place] PA-C  [facility] 05/22/2014 3:33 PM

## 2014-05-23 ENCOUNTER — Other Ambulatory Visit: Payer: Self-pay

## 2014-05-23 DIAGNOSIS — Z1239 Encounter for other screening for malignant neoplasm of breast: Secondary | ICD-10-CM

## 2014-05-23 LAB — CBC
HCT: 39.1 % (ref 34.0–46.6)
Hemoglobin: 12.9 g/dL (ref 11.1–15.9)
MCH: 29.2 pg (ref 26.6–33.0)
MCHC: 33 g/dL (ref 31.5–35.7)
MCV: 89 fL (ref 79–97)
Platelets: 262 10*3/uL (ref 150–379)
RBC: 4.42 x10E6/uL (ref 3.77–5.28)
RDW: 13 % (ref 12.3–15.4)
WBC: 4 10*3/uL (ref 3.4–10.8)

## 2014-05-23 LAB — COMPREHENSIVE METABOLIC PANEL
ALT: 13 IU/L (ref 0–32)
AST: 13 IU/L (ref 0–40)
Albumin/Globulin Ratio: 1.5 (ref 1.1–2.5)
Albumin: 4.4 g/dL (ref 3.5–5.5)
Alkaline Phosphatase: 50 IU/L (ref 39–117)
BUN/Creatinine Ratio: 17 (ref 9–23)
BUN: 12 mg/dL (ref 6–24)
Bilirubin Total: 0.4 mg/dL (ref 0.0–1.2)
CO2: 26 mmol/L (ref 18–29)
CREATININE: 0.72 mg/dL (ref 0.57–1.00)
Calcium: 10 mg/dL (ref 8.7–10.2)
Chloride: 99 mmol/L (ref 97–108)
GFR calc Af Amer: 119 mL/min/{1.73_m2} (ref >59)
GFR calc non Af Amer: 104 mL/min/{1.73_m2} (ref >59)
Globulin, Total: 2.9 g/dL (ref 1.5–4.5)
Glucose: 95 mg/dL (ref 65–99)
Potassium: 4.5 mmol/L (ref 3.5–5.2)
Sodium: 139 mmol/L (ref 134–144)
Total Protein: 7.3 g/dL (ref 6.0–8.5)

## 2014-05-23 LAB — LIPID PANEL
CHOLESTEROL TOTAL: 155 mg/dL (ref 100–199)
Chol/HDL Ratio: 2.8 ratio units (ref 0.0–4.4)
HDL: 55 mg/dL (ref >39)
LDL CALC: 87 mg/dL (ref 0–99)
Triglycerides: 64 mg/dL (ref 0–149)
VLDL Cholesterol Cal: 13 mg/dL (ref 5–40)

## 2014-05-23 LAB — TSH: TSH: 1.08 u[IU]/mL (ref 0.450–4.500)

## 2014-05-26 ENCOUNTER — Encounter: Payer: Self-pay | Admitting: Physician Assistant

## 2014-06-26 ENCOUNTER — Other Ambulatory Visit: Payer: Self-pay

## 2014-06-26 DIAGNOSIS — Z1231 Encounter for screening mammogram for malignant neoplasm of breast: Secondary | ICD-10-CM

## 2014-07-30 ENCOUNTER — Encounter: Payer: Self-pay | Admitting: Physician Assistant

## 2014-07-30 DIAGNOSIS — I493 Ventricular premature depolarization: Secondary | ICD-10-CM | POA: Insufficient documentation

## 2014-07-30 DIAGNOSIS — I341 Nonrheumatic mitral (valve) prolapse: Secondary | ICD-10-CM | POA: Insufficient documentation

## 2014-10-31 ENCOUNTER — Encounter: Payer: Self-pay | Admitting: Family Medicine

## 2015-01-09 DIAGNOSIS — I493 Ventricular premature depolarization: Secondary | ICD-10-CM

## 2015-09-30 IMAGING — MG MM SCREEN MAMMOGRAM BILATERAL
4 series · 4 of 4 positions shown · non-contrast
Comparison: Previous exam(s).

CLINICAL DATA: Screening.

EXAM:
DIGITAL SCREENING BILATERAL MAMMOGRAM WITH CAD

[R CC]
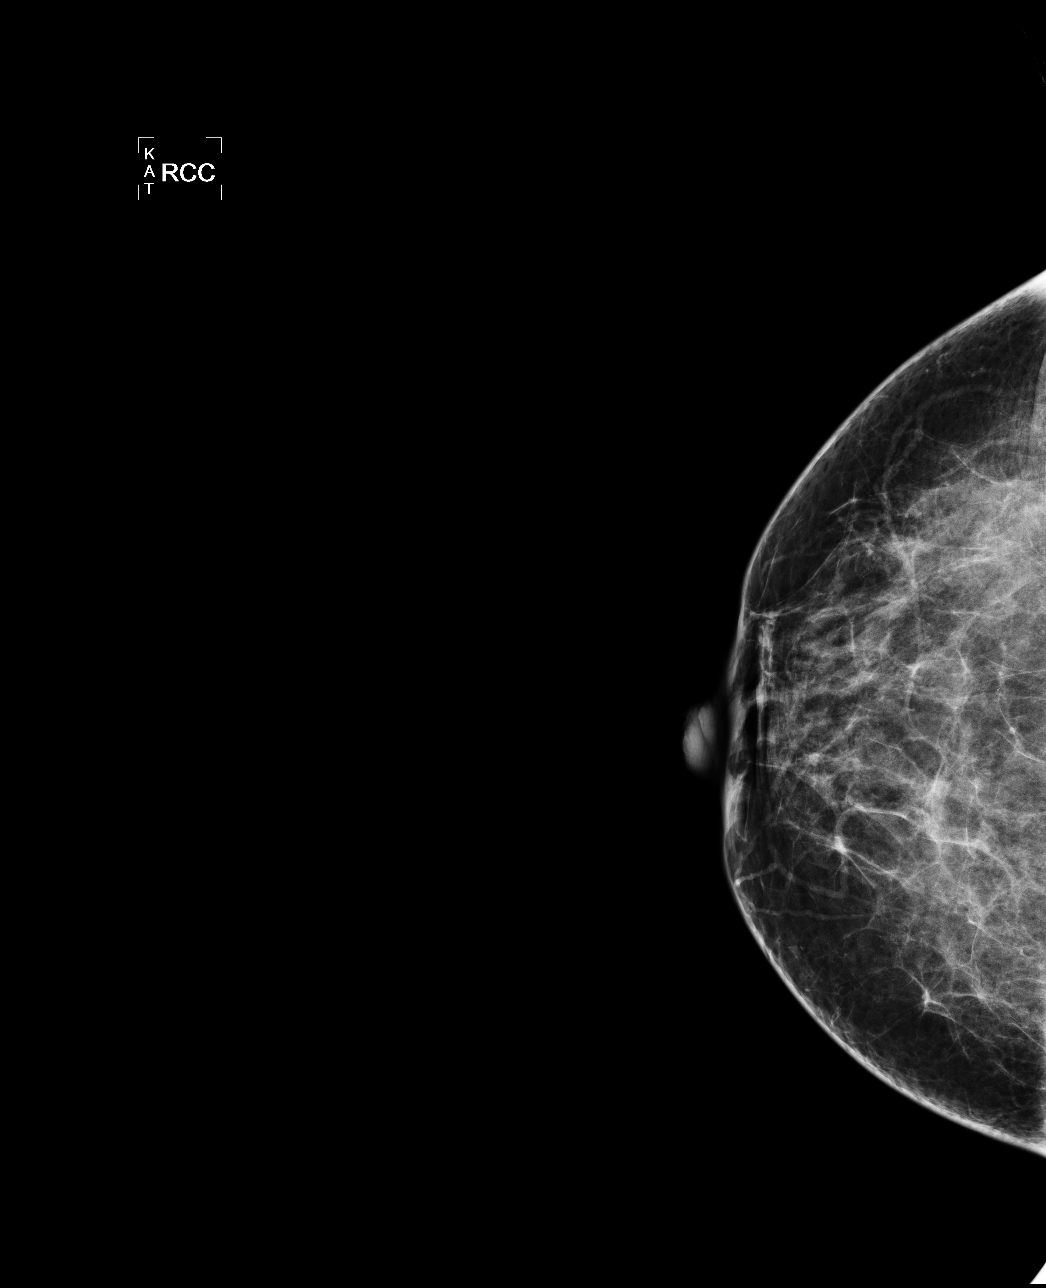

[L CC]
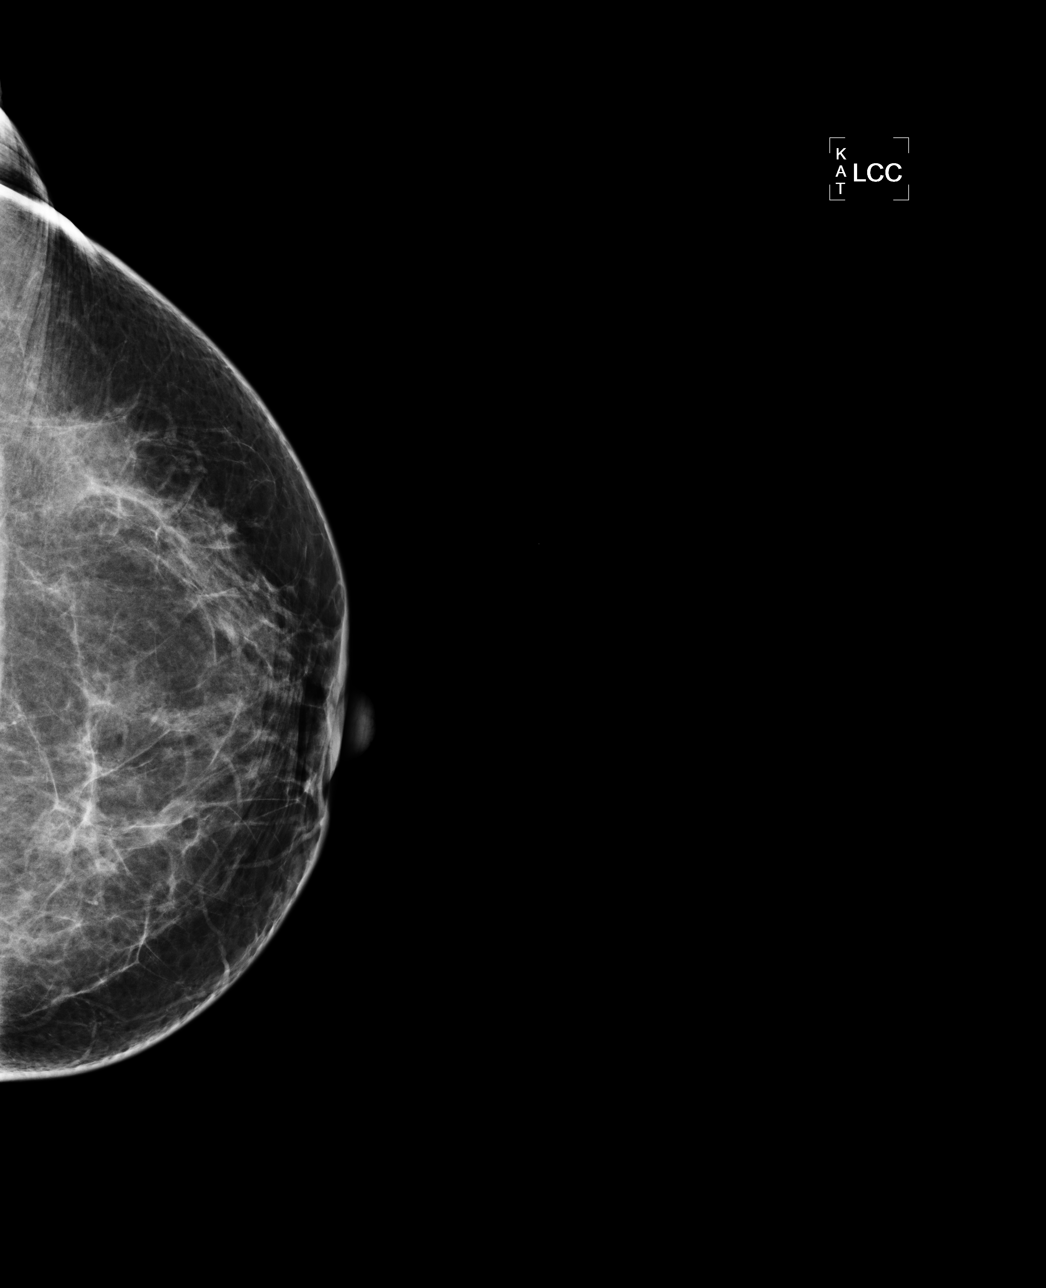

[L MLO]
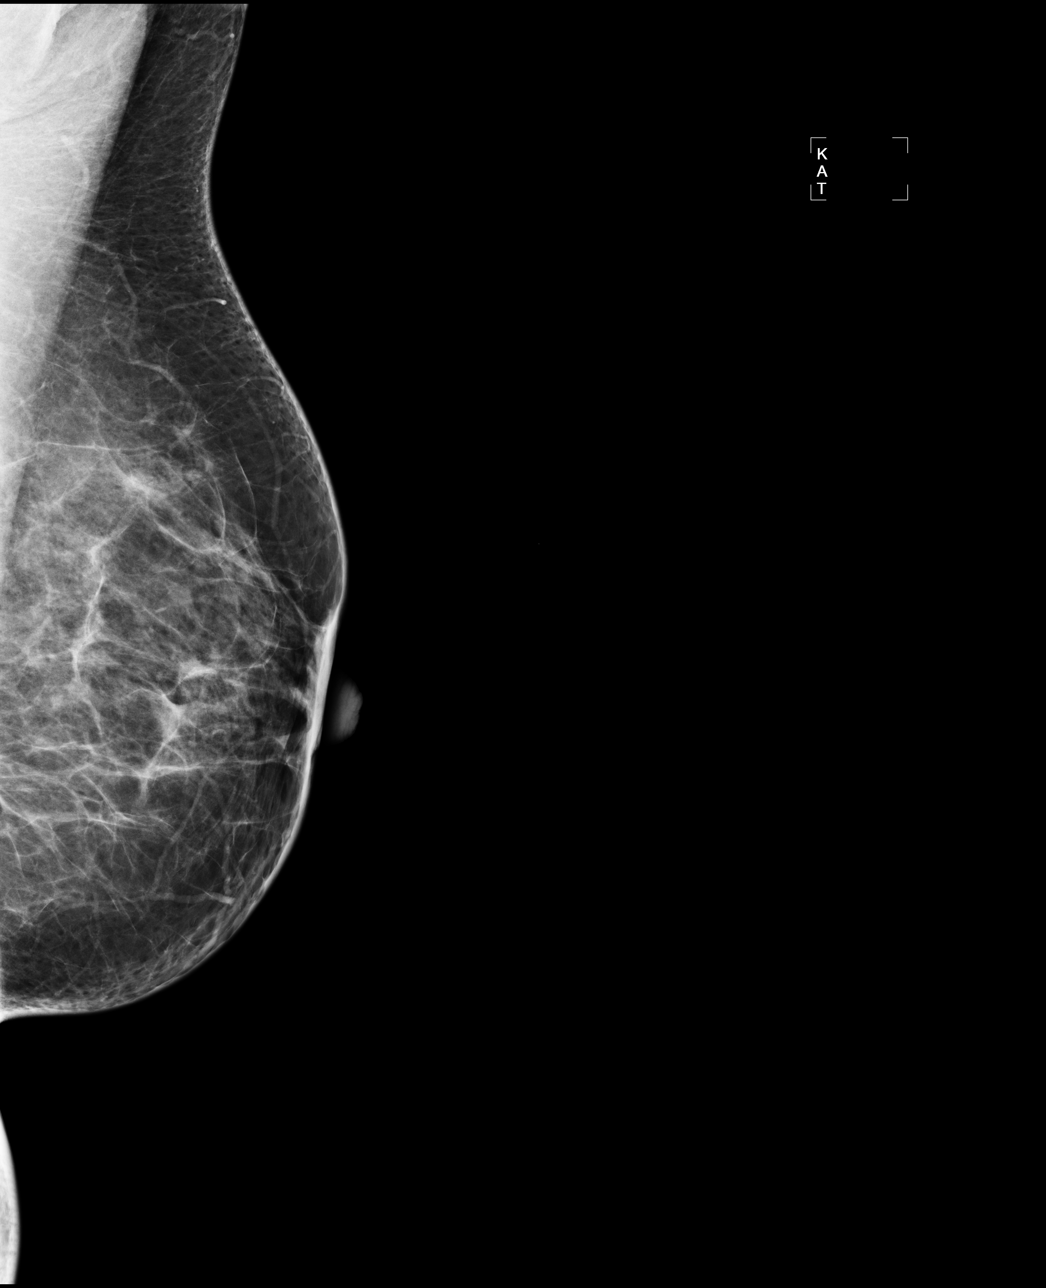

[R MLO]
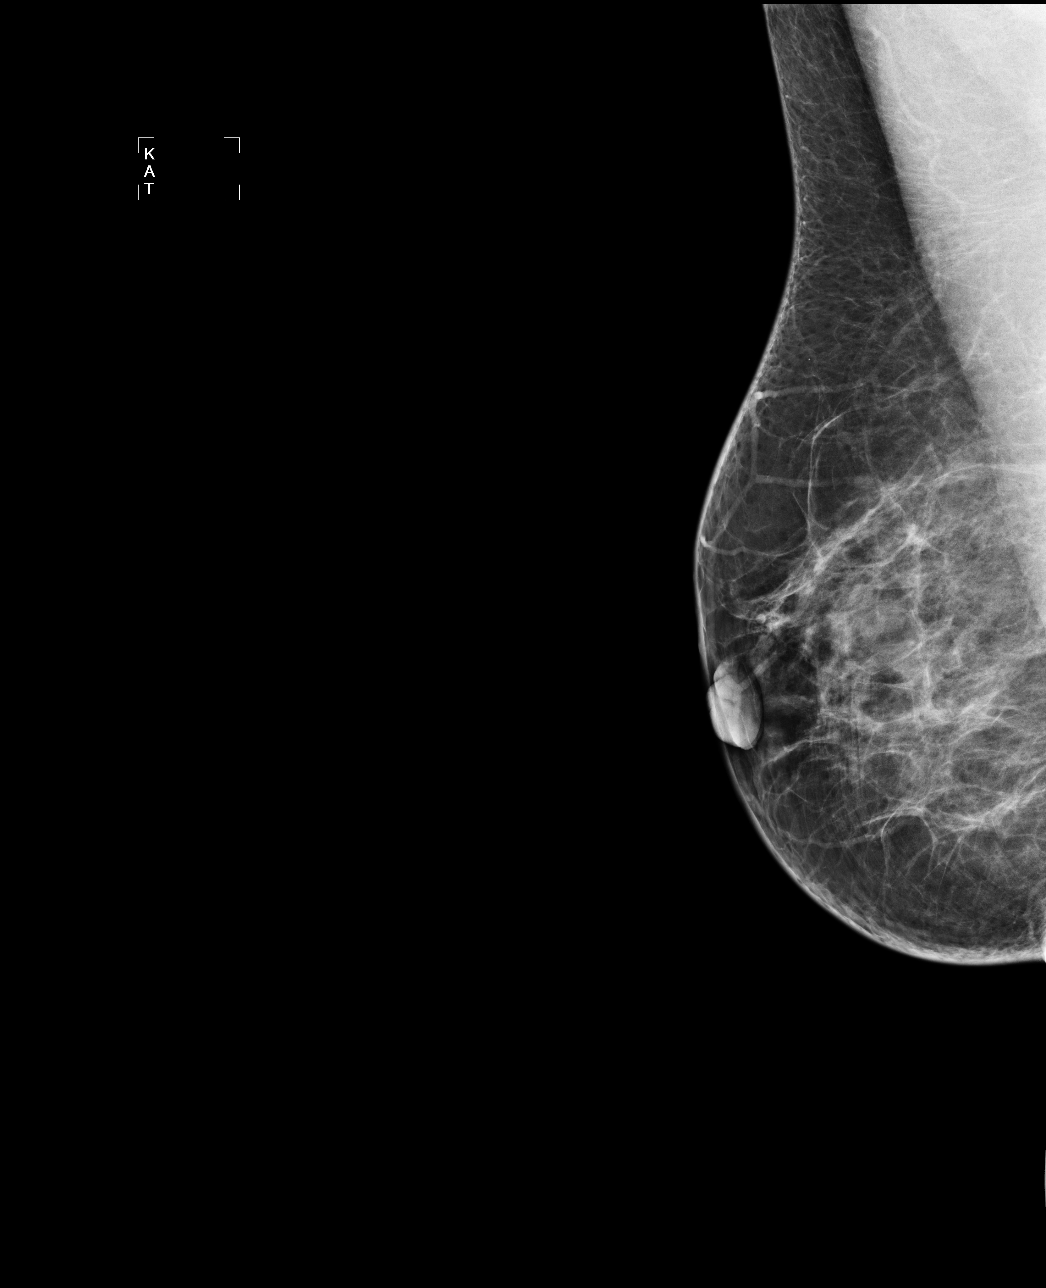

[4 of 4 positions shown; findings below may reference images not displayed]

ACR Breast Density Category b: There are scattered areas of
fibroglandular density.
FINDINGS: There are no findings suspicious for malignancy. Images were
processed with CAD.
IMPRESSION: No mammographic evidence of malignancy. A result letter of this
screening mammogram will be mailed directly to the patient.

RECOMMENDATION:
Screening mammogram in one year. (Code:AS-G-LCT)

BI-RADS CATEGORY  1: Negative.

## 2016-01-27 DIAGNOSIS — K219 Gastro-esophageal reflux disease without esophagitis: Secondary | ICD-10-CM | POA: Insufficient documentation

## 2016-01-27 DIAGNOSIS — R070 Pain in throat: Secondary | ICD-10-CM | POA: Insufficient documentation

## 2017-10-18 ENCOUNTER — Ambulatory Visit: Payer: Self-pay | Admitting: Family Medicine

## 2017-10-25 ENCOUNTER — Inpatient Hospital Stay: Payer: Self-pay | Admitting: Genetic Counselor

## 2017-10-25 ENCOUNTER — Inpatient Hospital Stay: Payer: Self-pay

## 2017-11-02 ENCOUNTER — Ambulatory Visit: Payer: Self-pay | Admitting: Family Medicine

## 2017-11-21 ENCOUNTER — Institutional Professional Consult (permissible substitution): Payer: Self-pay | Admitting: Medical

## 2017-11-22 ENCOUNTER — Encounter: Payer: Self-pay | Admitting: Medical

## 2017-11-22 ENCOUNTER — Ambulatory Visit: Payer: Managed Care, Other (non HMO) | Admitting: Medical

## 2017-11-22 VITALS — BP 112/70 | HR 76 | Temp 98.1°F | Resp 16 | Ht 69.0 in | Wt 196.4 lb

## 2017-11-22 DIAGNOSIS — E8881 Metabolic syndrome: Secondary | ICD-10-CM | POA: Insufficient documentation

## 2017-11-22 DIAGNOSIS — Z23 Encounter for immunization: Secondary | ICD-10-CM | POA: Insufficient documentation

## 2017-11-22 DIAGNOSIS — R748 Abnormal levels of other serum enzymes: Secondary | ICD-10-CM | POA: Diagnosis not present

## 2017-11-22 DIAGNOSIS — R030 Elevated blood-pressure reading, without diagnosis of hypertension: Secondary | ICD-10-CM | POA: Insufficient documentation

## 2017-11-22 DIAGNOSIS — Z6829 Body mass index (BMI) 29.0-29.9, adult: Secondary | ICD-10-CM | POA: Diagnosis not present

## 2017-11-22 NOTE — Progress Notes (Signed)
Subjective: Chief Complaint  Patient presents with  . NP    NP get established  discuss lab work   Here as a new patient.  I see her sister Stacey Hart as a patient.  She had a recent health screening at work that said she had metabolic syndrome.  Here to discuss this  She is not currently exercising and has not been exercising since first of this year.  She denies eating a bunch of sweets or junk food but does tend to eat a lot of bread and grains.  Some weeks she does not eat meat.  Does not feel that she is eating all the unhealthy, but  she is the heaviest weight she has ever been  She wants some guidance on this.  Needs a form completed for work shows she is taking steps to work on this  Past Medical History:  Diagnosis Date  . Allergy    Current Outpatient Medications on File Prior to Visit  Medication Sig Dispense Refill  . omeprazole (PRILOSEC) 20 MG capsule Take 20 mg by mouth daily.     No current facility-administered medications on file prior to visit.    ROS as in subjective   Objective: BP 112/70   Pulse 76   Temp 98.1 F (36.7 C) (Oral)   Resp 16   Ht 5' 9"  (1.753 m)   Wt 196 lb 6.4 oz (89.1 kg)   LMP 10/19/2017 (Approximate)   SpO2 98%   BMI 29.00 kg/m   Wt Readings from Last 3 Encounters:  11/22/17 196 lb 6.4 oz (89.1 kg)  05/22/14 175 lb 12.8 oz (79.7 kg)  08/20/12 183 lb 8 oz (83.2 kg)    Gen: wd, wn, nad Psych: Pleasant, good eye contact, and his questions appropriately   Assessment: Encounter Diagnoses  Name Primary?  . Metabolic syndrome Yes  . Low serum HDL   . Elevated blood-pressure reading without diagnosis of hypertension   . Adult BMI 29.0-29.9 kg/sq m   . Need for influenza vaccination     Plan: We discussed her recent health screening form after work which included low HDL, BMI over 29, waist circumference 38, hemoglobin A1c 5.7, elevated blood pressure, elevated triglycerides over 150.  And over the last few years she has  been trending towards these numbers gradually.  We discussed exercise, great deal of time discussing healthy food choices, eating several small portions throughout the day, measuring out grains such as pasta and rice.  We discussed goal setting.  Advised to make small steps to get started.  Advised 150 minutes or more of exercise weekly including combination of moderate aerobic exercise, high intensity cardio exercise as well as weightbearing exercise.  I completed her health screening form showing some recommendations that she will work on  Counseled on the influenza virus vaccine.  Vaccine information sheet given.  Influenza vaccine given after consent obtained.  F/u 4-6 wk for physical

## 2017-11-29 ENCOUNTER — Encounter: Payer: Self-pay | Admitting: Medical

## 2017-12-20 ENCOUNTER — Ambulatory Visit (INDEPENDENT_AMBULATORY_CARE_PROVIDER_SITE_OTHER): Payer: Managed Care, Other (non HMO) | Admitting: Medical

## 2017-12-20 ENCOUNTER — Other Ambulatory Visit (HOSPITAL_COMMUNITY)
Admission: RE | Admit: 2017-12-20 | Discharge: 2017-12-20 | Disposition: A | Discharge status: Home / Self Care | Payer: Managed Care, Other (non HMO) | Source: Ambulatory Visit | Attending: Medical | Admitting: Medical

## 2017-12-20 ENCOUNTER — Encounter: Payer: Self-pay | Admitting: Medical

## 2017-12-20 VITALS — BP 118/70 | HR 76 | Temp 98.2°F | Resp 16 | Ht 69.0 in | Wt 196.0 lb

## 2017-12-20 DIAGNOSIS — Z0000 Encounter for general adult medical examination without abnormal findings: Secondary | ICD-10-CM

## 2017-12-20 DIAGNOSIS — Z124 Encounter for screening for malignant neoplasm of cervix: Secondary | ICD-10-CM | POA: Insufficient documentation

## 2017-12-20 DIAGNOSIS — Z1239 Encounter for other screening for malignant neoplasm of breast: Secondary | ICD-10-CM

## 2017-12-20 DIAGNOSIS — R748 Abnormal levels of other serum enzymes: Secondary | ICD-10-CM

## 2017-12-20 DIAGNOSIS — Z803 Family history of malignant neoplasm of breast: Secondary | ICD-10-CM | POA: Insufficient documentation

## 2017-12-20 DIAGNOSIS — Z13220 Encounter for screening for lipoid disorders: Secondary | ICD-10-CM | POA: Insufficient documentation

## 2017-12-20 LAB — POCT URINALYSIS DIP (PROADVANTAGE DEVICE)
Bilirubin, UA: NEGATIVE
Blood, UA: NEGATIVE
Glucose, UA: NEGATIVE mg/dL
Ketones, POC UA: NEGATIVE mg/dL
Leukocytes, UA: NEGATIVE
Nitrite, UA: NEGATIVE
Protein Ur, POC: NEGATIVE mg/dL
Specific Gravity, Urine: 1.03
Urobilinogen, Ur: NEGATIVE
pH, UA: 6 (ref 5.0–8.0)

## 2017-12-20 NOTE — Patient Instructions (Signed)
Recommendations:  See your eye doctor yearly for routine vision care. See your dentist yearly for routine dental care including hygiene visits twice yearly.  Vaccines  Get your flu shot yearly  You will be due for tetanus booster in 2021  Consider MMR mumps measles rubella booster since your recent screen at work shows that you are not quite immune to one of the components  Cancer screening Check insurance coverage for colon cancer screening, either Cologard or Colonoscopy  We will send pap smear today, and we will order mammogram screening      I want you to eat 3 meals a day +2 snacks, one midmorning snack and one mid afternoon snack  Breakfast You may eat 1 of the following  Omelette, which can include a small amount of cheese, and vegetables such as peppers, mushrooms, small pieces of [place] or chicken  Low sugar yogurt serving which can include some fruit such as berries  Egg whites or hard boiled egg and meat (1-2 strips of bacon, or small piece of [place] sausage or [place] bacon)   Mid-morning snack 1 fruit serving such as one of the following:  medium-sized apple  medium-sized orange,  Tangerine  1/2 banana   3/4 cup of fresh berries or frozen berries  A protein source such as one of the following:  8 almonds   small handful of walnuts or other nuts  small piece of cheese,  low sugar yogurt   Lunch A protein source such as 1 of the following: . 1 serving of beans such as black beans, pinto beans, green beans, or edamame (soy beans) . 1 meat serving such as 6 oz or deck of card size serving of fish, skinless chicken, or [place], either grilled or baked preferably.   You can use some pork or beef, but limit this compared to fish, chicken or [place] Vegetable - Half of your plate should be a non-starchy vegetables!  So avoid white potatoes and corn.  Otherwise, eat a large portion of vegetables. . Avocado, cucumber, tomato, carrots, greens, lettuce,  squash, okra, etc.  . Vegetables can include salad with olive oil/vinaigrette dressing   Mid-afternoon snack 1 fruit serving such as one of the following:  medium-sized apple  medium-sized orange,  Tangerine  1/2 banana   3/4 cup of fresh berries or frozen berries  A protein source such as one of the following:  8 almonds   small handful of walnuts or other nuts  small piece of cheese,  low sugar yogurt   Dinner A protein source such as 1 of the following: . 1 serving of beans such as black beans, pinto beans, green beans, or edamame (soy beans) . 1 meat serving such as 6 oz or deck of card size serving of fish, skinless chicken, or [place], either grilled or baked preferably.   You can use some pork or beef, but limit this compared to fish, chicken or [place] Vegetable - Half of your plate should be a non-starchy vegetables!  So avoid white potatoes and corn.  Otherwise, eat a large portion of vegetables. . Avocado, cucumber, tomato, carrots, greens, lettuce, squash, okra, etc.  . Vegetables can include salad with olive oil/vinaigrette dressing   Beverages: Water Unsweet tea Home made juice with a juicer without sugar added other than small bit of honey or agave nectar Water with sugar free flavor such as [place]   AVOID.... For the time being I want you to cut out the following items completely: .  Soda, sweet tea, juice, beer or wine or alcohol . ALL grains and breads including rice, pasta, bread, cereal . Sweets such as cake, candy, pies, chips, cookies, chocolate

## 2017-12-20 NOTE — Progress Notes (Signed)
Subjective:   HPI  Stacey Hart is a 47 y.o. female who presents for Chief Complaint  Patient presents with  . CPE    non fasting CPE pap     Medical care team includes: Anthonee Gelin, Camelia Eng, PA-C here for primary care Dentist Eye doctor  Concerns: none  Gynecological history: 4 pregnancies, 4 live births. Periods are regular Last pap 2016 normal, last several normal   Reviewed their medical, surgical, family, social, medication, and allergy history and updated chart as appropriate.  Past Medical History:  Diagnosis Date  . Allergy   . GERD (gastroesophageal reflux disease)   . Migraines    in past, but not currently 12/2017    Past Surgical History:  Procedure Laterality Date  . FOOT SURGERY     left  . HERNIA REPAIR     umbilicus  . TUBAL LIGATION      Social History   Socioeconomic History  . Marital status: Married    Spouse name: Not on file  . Number of children: Not on file  . Years of education: Not on file  . Highest education level: Not on file  Occupational History  . Occupation: Optometrist  Social Needs  . Financial resource strain: Not on file  . Food insecurity:    Worry: Not on file    Inability: Not on file  . Transportation needs:    Medical: Not on file    Non-medical: Not on file  Tobacco Use  . Smoking status: Never Smoker  . Smokeless tobacco: Never Used  Substance and Sexual Activity  . Alcohol use: Yes    Alcohol/week: 7.0 standard drinks    Types: 7 Glasses of wine per week  . Drug use: No  . Sexual activity: Yes    Birth control/protection: Surgical  Lifestyle  . Physical activity:    Days per week: Not on file    Minutes per session: Not on file  . Stress: Not on file  Relationships  . Social connections:    Talks on phone: Not on file    Gets together: Not on file    Attends religious service: Not on file    Active member of club or organization: Not on file    Attends meetings of clubs or organizations:  Not on file    Relationship status: Not on file  . Intimate partner violence:    Fear of current or ex partner: Not on file    Emotionally abused: Not on file    Physically abused: Not on file    Forced sexual activity: Not on file  Other Topics Concern  . Not on file  Social History Narrative   Married, 4 children, 2 live at home.   Works in Press photographer at [employer].   Exercise - walking , stretcihng.  12/2017       Family History  Problem Relation Age of Onset  . Cancer Mother        breast  . HIV/AIDS Father   . Cancer Maternal Grandmother        bone cancer  . Stroke Maternal Grandmother   . Cancer Maternal Grandfather        prostate cancer  . Diabetes Neg Hx   . Heart disease Neg Hx      Current Outpatient Medications:  .  omeprazole (PRILOSEC) 20 MG capsule, Take 20 mg by mouth daily., Disp: , Rfl:   Allergies  Allergen Reactions  . Latex Other (See Comments)  VAGINAL AREA - BURNING AND ITCHING     Review of Systems Constitutional: -fever, -chills, -sweats, -unexpected weight change, -decreased appetite, -fatigue Allergy: -sneezing, -itching, -congestion Dermatology: -changing moles, --rash, -lumps ENT: -runny nose, -ear pain, -sore throat, -hoarseness, -sinus pain, -teeth pain, - ringing in ears, -hearing loss, -nosebleeds Cardiology: -chest pain, -palpitations, -swelling, -difficulty breathing when lying flat, -waking up short of breath Respiratory: -cough, -shortness of breath, -difficulty breathing with exercise or exertion, -wheezing, -coughing up blood Gastroenterology: -abdominal pain, -nausea, -vomiting, -diarrhea, -constipation, -blood in stool, -changes in bowel movement, -difficulty swallowing or eating Hematology: -bleeding, -bruising  Musculoskeletal: -joint aches, -muscle aches, -joint swelling, -back pain, -neck pain, -cramping, -changes in gait Ophthalmology: denies vision changes, eye redness, itching, discharge Urology: -burning with  urination, -difficulty urinating, -blood in urine, -urinary frequency, -urgency, -incontinence Neurology: -headache, -weakness, -tingling, -numbness, -memory loss, -falls, -dizziness Psychology: -depressed mood, -agitation, -sleep problems Breast/gyn: -breast tenderness, -discharge, -lumps, -vaginal discharge,- irregular periods, -heavy periods     Objective:  BP 118/70   Pulse 76   Temp 98.2 F (36.8 C) (Oral)   Resp 16   Ht 5' 9"  (1.753 m)   Wt 196 lb (88.9 kg)   LMP 11/18/2017 (Approximate)   SpO2 98%   BMI 28.94 kg/m   General appearance: alert, no distress, WD/WN, African American female Skin: small tattoo superiorly over left breast, no worrisome skin lesions HEENT: normocephalic, conjunctiva/corneas normal, sclerae anicteric, PERRLA, EOMi, nares patent, no discharge or erythema, pharynx normal Oral cavity: MMM, tongue normal, teeth normal Neck: supple, no lymphadenopathy, no thyromegaly, no masses, normal ROM, no bruits Chest: non tender, normal shape and expansion Heart: RRR, normal S1, S2, no murmurs Lungs: CTA bilaterally, no wheezes, rhonchi, or rales Abdomen: +bs, soft, non tender, non distended, no masses, no hepatomegaly, no splenomegaly, no bruits Back: non tender, normal ROM, no scoliosis Musculoskeletal: upper extremities non tender, no obvious deformity, normal ROM throughout, lower extremities non tender, no obvious deformity, normal ROM throughout Extremities: no edema, no cyanosis, no clubbing Pulses: 2+ symmetric, upper and lower extremities, normal cap refill Neurological: alert, oriented x 3, CN2-12 intact, strength normal upper extremities and lower extremities, sensation normal throughout, DTRs 2+ throughout, no cerebellar signs, gait normal Psychiatric: normal affect, behavior normal, pleasant  Breast: nontender, no masses or lumps, no skin changes, no nipple discharge or inversion, no axillary lymphadenopathy Gyn: Normal external genitalia without  lesions, vagina with normal mucosa, cervix without lesions, no cervical motion tenderness, no abnormal vaginal discharge.  Uterus and adnexa not enlarged, nontender, no masses.  Pap performed.  Exam chaperoned by nurse. Rectal: anus normal appearing    Assessment and Plan :   Encounter Diagnoses  Name Primary?  . Routine general medical examination at a health care facility Yes  . Low serum HDL   . Screening for cervical cancer   . Screening for breast cancer   . Family history of breast cancer     Physical exam - discussed and counseled on healthy lifestyle, diet, exercise, preventative care, vaccinations, sick and well care, proper use of emergency dept and after hours care, and addressed their concerns.    Health screening: See your eye doctor yearly for routine vision care. See your dentist yearly for routine dental care including hygiene visits twice yearly.  Cancer screening Discussed and advised monthly self breast exams Discussed mammogram, advised mammogram  Discussed pap smear recommendations.   Pap smear today  Colonoscopy:  Check insurance coverage for screening at age 53yo given  new reocmmendations  Vaccinations: Advised yearly influenza vaccine Up to date on Td vaccine She had MMR screen at work with lacking in [place].  She will consider booster.   Acute issues discussed: none  Separate significant chronic issues discussed: none  Baila was seen today for cpe.  Diagnoses and all orders for this visit:  Routine general medical examination at a health care facility -     POCT Urinalysis DIP (Proadvantage Device) -     Cytology - PAP -     MM DIGITAL SCREENING BILATERAL; Future -     Comprehensive metabolic panel -     CBC with Differential/Platelet -     FSH/LH -     HIV Antibody (routine testing w rflx)  Low serum HDL  Screening for cervical cancer -     Cytology - PAP  Screening for breast cancer -     MM DIGITAL SCREENING BILATERAL;  Future  Family history of breast cancer   Follow-up pending labs, yearly for physical

## 2017-12-21 LAB — CBC WITH DIFFERENTIAL/PLATELET
BASOS ABS: 0 10*3/uL (ref 0.0–0.2)
Basos: 1 %
EOS (ABSOLUTE): 0 10*3/uL (ref 0.0–0.4)
Eos: 1 %
Hematocrit: 37.5 % (ref 34.0–46.6)
Hemoglobin: 12.7 g/dL (ref 11.1–15.9)
IMMATURE GRANULOCYTES: 0 %
Immature Grans (Abs): 0 10*3/uL (ref 0.0–0.1)
LYMPHS: 30 %
Lymphocytes Absolute: 1.7 10*3/uL (ref 0.7–3.1)
MCH: 29.6 pg (ref 26.6–33.0)
MCHC: 33.9 g/dL (ref 31.5–35.7)
MCV: 87 fL (ref 79–97)
Monocytes Absolute: 0.5 10*3/uL (ref 0.1–0.9)
Monocytes: 9 %
NEUTROS PCT: 59 %
Neutrophils Absolute: 3.4 10*3/uL (ref 1.4–7.0)
Platelets: 241 10*3/uL (ref 150–450)
RBC: 4.29 x10E6/uL (ref 3.77–5.28)
RDW: 12.3 % (ref 12.3–15.4)
WBC: 5.7 10*3/uL (ref 3.4–10.8)

## 2017-12-21 LAB — HIV ANTIBODY (ROUTINE TESTING W REFLEX): HIV SCREEN 4TH GENERATION: NONREACTIVE

## 2017-12-21 LAB — FSH/LH
FSH: 2 m[IU]/mL
LH: 1.7 m[IU]/mL

## 2017-12-21 LAB — COMPREHENSIVE METABOLIC PANEL
ALT: 14 IU/L (ref 0–32)
AST: 10 IU/L (ref 0–40)
Albumin/Globulin Ratio: 1.7 (ref 1.2–2.2)
Albumin: 4.5 g/dL (ref 3.5–5.5)
Alkaline Phosphatase: 54 IU/L (ref 39–117)
BUN/Creatinine Ratio: 15 (ref 9–23)
BUN: 13 mg/dL (ref 6–24)
Bilirubin Total: 0.4 mg/dL (ref 0.0–1.2)
CO2: 23 mmol/L (ref 20–29)
Calcium: 10 mg/dL (ref 8.7–10.2)
Chloride: 101 mmol/L (ref 96–106)
Creatinine, Ser: 0.86 mg/dL (ref 0.57–1.00)
GFR calc Af Amer: 94 mL/min/{1.73_m2} (ref >59)
GFR calc non Af Amer: 82 mL/min/{1.73_m2} (ref >59)
Globulin, Total: 2.7 g/dL (ref 1.5–4.5)
Glucose: 94 mg/dL (ref 65–99)
Potassium: 4.1 mmol/L (ref 3.5–5.2)
Sodium: 136 mmol/L (ref 134–144)
Total Protein: 7.2 g/dL (ref 6.0–8.5)

## 2017-12-22 LAB — CYTOLOGY - PAP: Diagnosis: NEGATIVE

## 2018-01-11 ENCOUNTER — Encounter: Payer: Self-pay | Admitting: Pulmonary Disease

## 2018-01-11 ENCOUNTER — Ambulatory Visit: Payer: Managed Care, Other (non HMO) | Admitting: Pulmonary Disease

## 2018-01-11 VITALS — BP 138/88 | HR 103 | Ht 69.0 in | Wt 198.4 lb

## 2018-01-11 DIAGNOSIS — R05 Cough: Secondary | ICD-10-CM

## 2018-01-11 DIAGNOSIS — J3089 Other allergic rhinitis: Secondary | ICD-10-CM

## 2018-01-11 DIAGNOSIS — J302 Other seasonal allergic rhinitis: Secondary | ICD-10-CM | POA: Diagnosis not present

## 2018-01-11 DIAGNOSIS — K219 Gastro-esophageal reflux disease without esophagitis: Secondary | ICD-10-CM | POA: Diagnosis not present

## 2018-01-11 DIAGNOSIS — R059 Cough, unspecified: Secondary | ICD-10-CM

## 2018-01-11 MED ORDER — MONTELUKAST SODIUM 10 MG PO TABS: 10.0000 mg | ORAL_TABLET | Freq: Every day | ORAL | 3 refills | Status: DC

## 2018-01-11 MED ORDER — ALBUTEROL SULFATE HFA 108 (90 BASE) MCG/ACT IN AERS: 2.0000 | INHALATION_SPRAY | Freq: Four times a day (QID) | RESPIRATORY_TRACT | 2 refills | Status: DC | PRN

## 2018-01-11 MED ORDER — OMEPRAZOLE 20 MG PO CPDR: 20.0000 mg | DELAYED_RELEASE_CAPSULE | Freq: Two times a day (BID) | ORAL | 2 refills | Status: DC

## 2018-01-11 NOTE — Progress Notes (Signed)
Patient seen in the office today and instructed on use of Albuterol.  Patient expressed understanding and demonstrated technique.

## 2018-01-11 NOTE — Progress Notes (Signed)
Subjective:    Patient ID: Stacey Hart, female    DOB: 05/07/1970, 47 y.o.   MRN: [id number]  HPI Patient is a 48 year old lifelong never smoker who presents as a self-referral for evaluation of cough she is concerned she may have asthma.  The patient states that she gets sudden urges to cough and the sensation that she cannot take a breath in.  Symptoms are not gradual.  They are actually quite sudden.  She has actually had issues for several years but gets these "attacks" at least 2-3 times per week.  She has been evaluated previously at [facility] by ENT and noted to have significant laryngopharyngeal reflux.  It was recommended that she take PPI twice a day.  She has had issues with her insurance and so she only takes PPI in the form of omeprazole 20 mg daily but admits that recently she has not been taking it.  Her reflux and heartburn have gotten worse.  She had been previously fairly well controlled with regards to the symptoms but most recently her symptoms have recurred and she has had "attacks" more frequently.  She also notes significant postnasal drip and nasal congestion.  She will have cough worse at nighttime.  She will also sometimes have cough while eating.  She does not have any sputum production but when she coughs sometimes she regurgitates food particles.  He last saw ENT a year ago and has not followed up with them.  She does not endorse any fevers, chills or sweats.  No orthopnea or paroxysmal nocturnal dyspnea.  Appetite and weight have been stable.  Several days ago she had an episode and she states that her husband got her some Primatene Mist over-the-counter.  Noted that this relieved her symptoms somewhat.  She last took a dose of Primatene this morning.  She does get palpitations with the Primatene.  She is somewhat tachycardic today.   Past medical history, surgical history and family history have been reviewed and are as noted.  As noted she is a  lifelong never smoker.  She has a dog as a pet no exotic pets.  No unusual hobbies.  No military history.  She has never lived outside of [place].  No recent travel outside of the country.  She has never had exposure to tuberculosis.   Review of Systems  Constitutional: Negative.   HENT: Positive for congestion and postnasal drip.   Eyes: Negative.   Respiratory: Positive for cough and choking.   Cardiovascular: Negative.   Gastrointestinal:       Significant reflux symptoms as noted in HPI  Endocrine: Negative.   Genitourinary: Negative.   Musculoskeletal: Negative.   Skin: Negative.   Allergic/Immunologic: Negative.   Neurological: Negative.   Hematological: Negative.   Psychiatric/Behavioral: Negative.   All other systems reviewed and are negative.      Objective:   Physical Exam  Constitutional: She is oriented to person, place, and time. She appears well-developed and well-nourished. No distress.  HENT:  Head: Normocephalic and atraumatic.  Right Ear: External ear normal.  Left Ear: External ear normal.  Mouth/Throat: No oropharyngeal exudate.  She has turbinate edema and erythema.  Clear nasal discharge.  There is some postnasal drip noted.  Airway is Mallampati class II  Eyes: Pupils are equal, round, and reactive to light. Conjunctivae and EOM are normal. No scleral icterus.  Neck: Normal range of motion. Neck supple. No JVD present. No tracheal deviation present. No  thyromegaly present.  Cardiovascular: Regular rhythm, S1 normal, S2 normal and normal heart sounds.  No extrasystoles are present. Tachycardia present.  No murmur heard. Pulmonary/Chest: Effort normal and breath sounds normal. No respiratory distress.  Abdominal: Soft. She exhibits no distension.  Musculoskeletal: Normal range of motion. She exhibits no edema.  Lymphadenopathy:    She has no cervical adenopathy.  Neurological: She is alert and oriented to person, place, and time.  Skin: Skin is  warm and dry. No rash noted. She is not diaphoretic. No erythema. No pallor.  Psychiatric: She has a normal mood and affect. Her behavior is normal. Judgment and thought content normal.  Nursing note and vitals reviewed.    Spirometry performed today was essentially normal.    Assessment & Plan:   1.  Cough: By her description this appears to be related to laryngopharyngeal reflux.  We will proceed with treating this.  Other aggravating factors are perennial rhinitis with postnasal drip.  This will lead to upper airway cough syndrome, formerly known as postnasal drip syndrome.  In a never smoker the most common etiology of cough appears to be related to irritation of the upper airway.  At present I do not believe that she has asthma though she may have an element of airways reactivity.  For now we will have her use as needed albuterol as she did get some relief with Primatene Mist.  This may be treating some airways reactivity.  She has some issues with mild tachycardia today likely related to the Primatene Mist so we will switch her to albuterol.  Will obtain formal PFTs to reevaluate this issue.  She had on spirometry today to attempt that were normal and 1 that showed some mild obstruction.  This may have been effort dependent.  2.  Laryngopharyngeal reflux: I believe this is the main culprit of her cause of cough.  In addition she has had issues with the sensation of inability to take a breath in this is likely related to vocal cord spasm and dysfunction during episodes of lingual pharyngeal reflux.  She will be placed on Prilosec 20 mg twice a day.  3.  Perennial allergic rhinitis with postnasal drip: We will give her a trial of montelukast 10 mg daily.  She was instructed on proper nasal hygiene.   We will see her in follow-up in 3 to 4 weeks time she is to contact us prior to that time should any new difficulties arise.

## 2018-01-11 NOTE — Patient Instructions (Signed)
1.  You will be scheduled for breathing test.  2.  Start Singulair (montelukast) 1 tablet daily.  3.  Use albuterol (inhaler) 2 inhalations up to 4 times a day as needed.  4.  We have increased your Prilosec to twice a day.  I believe reflux is aggravating your symptoms.  5.  We will see you in follow-up in 3 to 4 weeks time call sooner if any new issues arise.

## 2018-01-23 ENCOUNTER — Telehealth: Payer: Self-pay

## 2018-01-23 NOTE — Telephone Encounter (Signed)
Attempted to call patient re: prilosec. VMbox full.

## 2018-01-24 ENCOUNTER — Telehealth: Payer: Self-pay | Admitting: Pulmonary Disease

## 2018-01-24 NOTE — Telephone Encounter (Signed)
LM on VM for patient that her insurance is not going to cover Omeprazole 33m tablets BID. She can get one rx through insurance and one over the counter. She is to call back if she has questions.

## 2018-01-24 NOTE — Telephone Encounter (Signed)
Attempted X2.

## 2018-01-25 ENCOUNTER — Ambulatory Visit: Payer: Managed Care, Other (non HMO)

## 2018-02-01 ENCOUNTER — Ambulatory Visit: Payer: Managed Care, Other (non HMO) | Admitting: Pulmonary Disease

## 2018-02-08 ENCOUNTER — Ambulatory Visit: Payer: Managed Care, Other (non HMO) | Attending: Pulmonary Disease

## 2018-04-27 ENCOUNTER — Other Ambulatory Visit: Payer: Self-pay | Admitting: Pulmonary Disease

## 2018-04-30 ENCOUNTER — Other Ambulatory Visit: Payer: Self-pay | Admitting: Pulmonary Disease

## 2018-04-30 MED ORDER — ALBUTEROL SULFATE HFA 108 (90 BASE) MCG/ACT IN AERS: 2.0000 | INHALATION_SPRAY | Freq: Four times a day (QID) | RESPIRATORY_TRACT | 2 refills | Status: DC | PRN

## 2018-09-30 ENCOUNTER — Other Ambulatory Visit: Payer: Self-pay | Admitting: Pulmonary Disease

## 2018-10-03 ENCOUNTER — Other Ambulatory Visit: Payer: Self-pay | Admitting: Pulmonary Disease

## 2018-10-03 ENCOUNTER — Other Ambulatory Visit: Payer: Self-pay

## 2018-10-03 MED ORDER — ALBUTEROL SULFATE HFA 108 (90 BASE) MCG/ACT IN AERS: 2.0000 | INHALATION_SPRAY | Freq: Four times a day (QID) | RESPIRATORY_TRACT | 0 refills | Status: DC | PRN

## 2019-03-27 ENCOUNTER — Encounter: Payer: Managed Care, Other (non HMO) | Admitting: Medical

## 2019-03-27 DIAGNOSIS — Z0000 Encounter for general adult medical examination without abnormal findings: Secondary | ICD-10-CM

## 2019-04-01 ENCOUNTER — Encounter: Payer: Self-pay | Admitting: Medical

## 2019-04-25 ENCOUNTER — Encounter: Payer: Self-pay | Admitting: Medical

## 2019-04-25 ENCOUNTER — Other Ambulatory Visit: Payer: Self-pay

## 2019-04-25 ENCOUNTER — Ambulatory Visit (INDEPENDENT_AMBULATORY_CARE_PROVIDER_SITE_OTHER): Payer: Managed Care, Other (non HMO) | Admitting: Medical

## 2019-04-25 VITALS — BP 112/76 | HR 99 | Temp 97.7°F | Ht 69.0 in | Wt 197.0 lb

## 2019-04-25 DIAGNOSIS — Z1211 Encounter for screening for malignant neoplasm of colon: Secondary | ICD-10-CM

## 2019-04-25 DIAGNOSIS — Z124 Encounter for screening for malignant neoplasm of cervix: Secondary | ICD-10-CM

## 2019-04-25 DIAGNOSIS — I341 Nonrheumatic mitral (valve) prolapse: Secondary | ICD-10-CM

## 2019-04-25 DIAGNOSIS — Z803 Family history of malignant neoplasm of breast: Secondary | ICD-10-CM | POA: Diagnosis not present

## 2019-04-25 DIAGNOSIS — Z0000 Encounter for general adult medical examination without abnormal findings: Secondary | ICD-10-CM | POA: Diagnosis not present

## 2019-04-25 DIAGNOSIS — N946 Dysmenorrhea, unspecified: Secondary | ICD-10-CM

## 2019-04-25 DIAGNOSIS — R748 Abnormal levels of other serum enzymes: Secondary | ICD-10-CM

## 2019-04-25 DIAGNOSIS — Z13220 Encounter for screening for lipoid disorders: Secondary | ICD-10-CM

## 2019-04-25 DIAGNOSIS — Z1239 Encounter for other screening for malignant neoplasm of breast: Secondary | ICD-10-CM

## 2019-04-25 DIAGNOSIS — Z23 Encounter for immunization: Secondary | ICD-10-CM | POA: Insufficient documentation

## 2019-04-25 DIAGNOSIS — R635 Abnormal weight gain: Secondary | ICD-10-CM | POA: Insufficient documentation

## 2019-04-25 DIAGNOSIS — R232 Flushing: Secondary | ICD-10-CM

## 2019-04-25 NOTE — Progress Notes (Signed)
Subjective:   HPI  Stacey Hart is a 49 y.o. female who presents for Chief Complaint  Patient presents with  . Annual Exam    with fasting labs     Patient Care Team: Eain Mullendore, Camelia Eng, PA-C as PCP - General (Family Medicine) Sees dentist Sees eye doctor dermatology through [facility]  Concerns: Since last year has been working form home due to covid restrictions and so far [employer] is going ot continue with her department from home.  She likes this but had gained up to 205 lb, but has lost it back.     Mom has stage 4 breast cancer, back in hospice after being able to come off hospice last year  She notes having heavy and irregular periods.   For past 8 months or so having what seems like 2 periods per month.  Cramps are more painful.   At the same time is also having more hot flashes, surges of heat, sleep problems.   No pain with intercourse.  No discharge.      Saw dermatology through televisit recent for eczema.  Uses desonide ointment and nizoral cream for facial rash/eczema.  Has had some recent left knee medial pain x 1week. This started after running in place for about 20 minutes, exercise.  No fall, no swelling, no other injury  Past Medical History:  Diagnosis Date  . Allergy   . Eczema   . GERD (gastroesophageal reflux disease)   . Migraines    in past, but not currently 12/2017    Past Surgical History:  Procedure Laterality Date  . FOOT SURGERY     left  . HERNIA REPAIR     umbilicus  . TUBAL LIGATION      Social History   Socioeconomic History  . Marital status: Married    Spouse name: Not on file  . Number of children: Not on file  . Years of education: Not on file  . Highest education level: Not on file  Occupational History  . Occupation: Optometrist  Tobacco Use  . Smoking status: Never Smoker  . Smokeless tobacco: Never Used  Substance and Sexual Activity  . Alcohol use: Yes    Alcohol/week: 2.0 standard drinks    Types: 2 Shots of  liquor per week  . Drug use: No  . Sexual activity: Yes    Birth control/protection: Surgical  Other Topics Concern  . Not on file  Social History Narrative   Married, 4 children, 2 live at home.   Works in Press photographer at [employer], been remote at home for about a year.   Exercise - walking , stretcihng.  04/2019   Social Determinants of Health   Financial Resource Strain:   . Difficulty of Paying Living Expenses:   Food Insecurity:   . Worried About Charity fundraiser in the Last Year:   . Arboriculturist in the Last Year:   Transportation Needs:   . Film/video editor (Medical):   Marland Kitchen Lack of Transportation (Non-Medical):   Physical Activity:   . Days of Exercise per Week:   . Minutes of Exercise per Session:   Stress:   . Feeling of Stress :   Social Connections:   . Frequency of Communication with Friends and Family:   . Frequency of Social Gatherings with Friends and Family:   . Attends Religious Services:   . Active Member of Clubs or Organizations:   . Attends Archivist Meetings:   .  Marital Status:   Intimate Partner Violence:   . Fear of Current or Ex-Partner:   . Emotionally Abused:   Marland Kitchen Physically Abused:   . Sexually Abused:     Family History  Problem Relation Age of Onset  . Cancer Mother 68       breast  . HIV/AIDS Father   . Cancer Maternal Grandmother        bone cancer  . Stroke Maternal Grandmother   . Cancer Maternal Grandfather        prostate cancer  . ADD / ADHD Son   . ADD / ADHD Daughter   . Diabetes Neg Hx   . Heart disease Neg Hx      Current Outpatient Medications:  .  albuterol (VENTOLIN HFA) 108 (90 Base) MCG/ACT inhaler, Inhale 2 puffs into the lungs every 6 (six) hours as needed for shortness of breath (Cough)., Disp: 18 g, Rfl: 0 .  desonide (DESOWEN) 0.05 % ointment, , Disp: , Rfl:  .  ketoconazole (NIZORAL) 2 % cream, , Disp: , Rfl:  .  montelukast (SINGULAIR) 10 MG tablet, Take 1 tablet (10 mg total) by mouth  daily., Disp: 90 tablet, Rfl: 3 .  omeprazole (PRILOSEC) 20 MG capsule, Take 1 capsule (20 mg total) by mouth 2 (two) times daily., Disp: 60 capsule, Rfl: 2  Allergies  Allergen Reactions  . Latex Other (See Comments)    VAGINAL AREA - BURNING AND ITCHING      Reviewed their medical, surgical, family, social, medication, and allergy history and updated chart as appropriate.   Review of Systems Constitutional: -fever, -chills, -sweats, -unexpected weight change, -decreased appetite, -fatigue Allergy: -sneezing, -itching, -congestion Dermatology: -changing moles, --rash, -lumps ENT: -runny nose, -ear pain, -sore throat, -hoarseness, -sinus pain, -teeth pain, - ringing in ears, -hearing loss, -nosebleeds Cardiology: -chest pain, -palpitations, -swelling, -difficulty breathing when lying flat, -waking up short of breath Respiratory: -cough, -shortness of breath, -difficulty breathing with exercise or exertion, -wheezing, -coughing up blood Gastroenterology: -abdominal pain, -nausea, -vomiting, -diarrhea, -constipation, -blood in stool, -changes in bowel movement, -difficulty swallowing or eating Hematology: -bleeding, -bruising  Musculoskeletal:  +joint aches, -muscle aches, -joint swelling, -back pain, -neck pain, -cramping, -changes in gait Ophthalmology: denies vision changes, eye redness, itching, discharge Urology: -burning with urination, -difficulty urinating, -blood in urine, -urinary frequency, -urgency, -incontinence Neurology: -headache, -weakness, -tingling, -numbness, -memory loss, -falls, -dizziness Psychology: -depressed mood, -agitation, +sleep problems Breast/gyn: -breast tendnerss, -discharge, -lumps, -vaginal discharge,- irregular periods, -heavy periods     Objective:  BP 112/76   Pulse 99   Temp 97.7 F (36.5 C)   Ht 5' 9"  (1.753 m)   Wt 197 lb (89.4 kg)   SpO2 99%   BMI 29.09 kg/m   General appearance: alert, no distress, WD/WN, African American  female Skin: unremarkable HEENT: normocephalic, conjunctiva/corneas normal, sclerae anicteric, PERRLA, EOMi, nares patent, no discharge or erythema, pharynx normal Oral cavity: MMM, tongue normal, teeth normal Neck: supple, no lymphadenopathy, no thyromegaly, no masses, normal ROM, no bruits Chest: non tender, normal shape and expansion Heart: RRR, normal S1, S2, no murmurs Lungs: CTA bilaterally, no wheezes, rhonchi, or rales Abdomen: +bs, soft, non tender, non distended, no masses, no hepatomegaly, no splenomegaly, no bruits Back: non tender, normal ROM, no scoliosis Musculoskeletal: + Mild tenderness of left medial knee however vastus medialis insertion otherwise nontender, no laxity, no swelling, no deformity, otherwise upper extremities non tender, no obvious deformity, normal ROM throughout, lower extremities non tender, no obvious  deformity, normal ROM throughout Extremities: no edema, no cyanosis, no clubbing Pulses: 2+ symmetric, upper and lower extremities, normal cap refill Neurological: alert, oriented x 3, CN2-12 intact, strength normal upper extremities and lower extremities, sensation normal throughout, DTRs 2+ throughout, no cerebellar signs, gait normal Psychiatric: normal affect, behavior normal, pleasant  Breast: nontender, no masses or lumps, no skin changes, no nipple discharge or inversion, no axillary lymphadenopathy. Exam chaperoned by nurse gyn/rectal - deferred to gynecology     Assessment and Plan :   Encounter Diagnoses  Name Primary?  . Encounter for health maintenance examination in adult Yes  . Family history of breast cancer   . Screening for cervical cancer   . Screen for colon cancer   . Mitral valve prolapse   . Encounter for screening for malignant neoplasm of breast, unspecified screening modality   . Low serum HDL   . Hot flashes   . Need for Td vaccine   . Dysmenorrhea   . Screening for lipid disorders   . Weight gain     Physical exam -  discussed and counseled on healthy lifestyle, diet, exercise, preventative care, vaccinations, sick and well care, proper use of emergency dept and after hours care, and addressed their concerns.    Health screening: Advised they see their eye doctor yearly for routine vision care. Advised they see their dentist yearly for routine dental care including hygiene visits twice yearly.  Cancer screening Counseled on self breast exams, mammograms, cervical cancer screening  Colonoscopy:  Advise she call insurance about coverage for first colonoscopy given age 11+  We will refer to gynecology for updated Pap smear with HPV screen is well as ultrasound given her dysmenorrhea  Counseled on skin surveillance  She will call to schedule her mammogram     Vaccinations: Advised yearly influenza vaccine  Advise Covid vaccine  Counseled on the Td (tetanus, diptheria) vaccine.  Vaccine information sheet given. Td vaccine given after consent obtained.    Sharunda was seen today for annual exam.  Diagnoses and all orders for this visit:  Encounter for health maintenance examination in adult -     MM DIGITAL SCREENING BILATERAL; Future -     Ambulatory referral to Gynecology -     Comprehensive metabolic panel -     CBC with Differential/Platelet -     TSH -     Iron -     Lipid panel  Family history of breast cancer -     MM DIGITAL SCREENING BILATERAL; Future  Screening for cervical cancer -     Ambulatory referral to Gynecology  Screen for colon cancer  Mitral valve prolapse  Encounter for screening for malignant neoplasm of breast, unspecified screening modality -     MM DIGITAL SCREENING BILATERAL; Future  Low serum HDL -     Lipid panel  Hot flashes  Need for Td vaccine  Dysmenorrhea -     Ambulatory referral to Gynecology -     CBC with Differential/Platelet -     Iron  Screening for lipid disorders -     Lipid panel  Weight gain -     TSH  Other  orders -     Td : Tetanus/diphtheria >7yo Preservative  free    Follow-up pending labs, yearly for physical

## 2019-04-26 ENCOUNTER — Other Ambulatory Visit: Payer: Self-pay | Admitting: Medical

## 2019-04-26 LAB — COMPREHENSIVE METABOLIC PANEL
ALT: 16 IU/L (ref 0–32)
AST: 14 IU/L (ref 0–40)
Albumin/Globulin Ratio: 1.9 (ref 1.2–2.2)
Albumin: 4.7 g/dL (ref 3.8–4.8)
Alkaline Phosphatase: 55 IU/L (ref 39–117)
BUN/Creatinine Ratio: 19 (ref 9–23)
BUN: 14 mg/dL (ref 6–24)
Bilirubin Total: 0.4 mg/dL (ref 0.0–1.2)
CO2: 22 mmol/L (ref 20–29)
Calcium: 9.9 mg/dL (ref 8.7–10.2)
Chloride: 101 mmol/L (ref 96–106)
Creatinine, Ser: 0.75 mg/dL (ref 0.57–1.00)
GFR calc Af Amer: 111 mL/min/{1.73_m2} (ref >59)
GFR calc non Af Amer: 96 mL/min/{1.73_m2} (ref >59)
Globulin, Total: 2.5 g/dL (ref 1.5–4.5)
Glucose: 95 mg/dL (ref 65–99)
Potassium: 4.8 mmol/L (ref 3.5–5.2)
Sodium: 136 mmol/L (ref 134–144)
Total Protein: 7.2 g/dL (ref 6.0–8.5)

## 2019-04-26 LAB — CBC WITH DIFFERENTIAL/PLATELET
Basophils Absolute: 0 10*3/uL (ref 0.0–0.2)
Basos: 1 %
EOS (ABSOLUTE): 0 10*3/uL (ref 0.0–0.4)
Eos: 1 %
Hematocrit: 40.8 % (ref 34.0–46.6)
Hemoglobin: 13.6 g/dL (ref 11.1–15.9)
Immature Grans (Abs): 0 10*3/uL (ref 0.0–0.1)
Immature Granulocytes: 0 %
Lymphocytes Absolute: 2.1 10*3/uL (ref 0.7–3.1)
Lymphs: 43 %
MCH: 29.8 pg (ref 26.6–33.0)
MCHC: 33.3 g/dL (ref 31.5–35.7)
MCV: 89 fL (ref 79–97)
Monocytes Absolute: 0.5 10*3/uL (ref 0.1–0.9)
Monocytes: 11 %
Neutrophils Absolute: 2.1 10*3/uL (ref 1.4–7.0)
Neutrophils: 44 %
Platelets: 232 10*3/uL (ref 150–450)
RBC: 4.57 x10E6/uL (ref 3.77–5.28)
RDW: 12.5 % (ref 11.7–15.4)
WBC: 4.8 10*3/uL (ref 3.4–10.8)

## 2019-04-26 LAB — LIPID PANEL
Chol/HDL Ratio: 3.1 ratio (ref 0.0–4.4)
Cholesterol, Total: 149 mg/dL (ref 100–199)
HDL: 48 mg/dL (ref >39)
LDL Chol Calc (NIH): 87 mg/dL (ref 0–99)
Triglycerides: 68 mg/dL (ref 0–149)
VLDL Cholesterol Cal: 14 mg/dL (ref 5–40)

## 2019-04-26 LAB — IRON: Iron: 78 ug/dL (ref 27–159)

## 2019-04-26 LAB — TSH: TSH: 1.47 u[IU]/mL (ref 0.450–4.500)

## 2019-05-16 ENCOUNTER — Other Ambulatory Visit: Payer: Self-pay

## 2019-05-20 ENCOUNTER — Ambulatory Visit (INDEPENDENT_AMBULATORY_CARE_PROVIDER_SITE_OTHER): Payer: Managed Care, Other (non HMO) | Admitting: Nurse Practitioner

## 2019-05-20 ENCOUNTER — Other Ambulatory Visit: Payer: Self-pay

## 2019-05-20 ENCOUNTER — Encounter: Payer: Self-pay | Admitting: Nurse Practitioner

## 2019-05-20 ENCOUNTER — Ambulatory Visit: Payer: Managed Care, Other (non HMO) | Admitting: Medical

## 2019-05-20 VITALS — BP 118/80 | Ht 69.0 in | Wt 198.0 lb

## 2019-05-20 DIAGNOSIS — N921 Excessive and frequent menstruation with irregular cycle: Secondary | ICD-10-CM | POA: Diagnosis not present

## 2019-05-20 DIAGNOSIS — N951 Menopausal and female climacteric states: Secondary | ICD-10-CM

## 2019-05-20 NOTE — Progress Notes (Signed)
Stacey Hart 1970-03-09 [id number]    History: 49 year old MBF G4P4 presents as a new patient for evaluation of irregular cycles.  This began about 8 months ago with cycles varying from 2 weeks to 3 months..  Denies very heavy menses, but sometimes she does have small clots.  Also experiencing hot flashes, insomnia, and anxiety.  Denies urinary symptoms, vaginal itching, discharge, or odor. Mom in hospice with stage IV breast cancer.  Patient plans to schedule mammogram soon, as well as a colonoscopy if insurance will cover.  She has had some weight gain in the past year.  Recent knee injury so she has not been exercising regularly and admits to not following a healthy diet.Tubal ligation.   Pap without HPV 12/2017 normal Mammogram 2015 normal  Past medical history, past surgical history, family history and social history were all reviewed and documented in the EPIC chart.  Works for [employer]:  A ROS was performed and pertinent positives and negatives are included.  Exam:  Vitals:   05/20/19 1522  BP: 118/80  Weight: 198 lb (89.8 kg)  Height: 5' 9"  (1.753 m)   Body mass index is 29.24 kg/m.   General appearance:  Normal Thyroid:  Symmetrical, normal in size, without palpable masses or nodularity. Respiratory  Auscultation:  Clear without wheezing or rhonchi Cardiovascular  Auscultation:  Regular rate, without rubs, murmurs or gallops  Edema/varicosities:  Not grossly evident Abdominal  Soft,nontender, without masses, guarding or rebound.  Liver/spleen:  No organomegaly noted  Hernia:  None appreciated  Skin  Inspection:  Grossly normal   Breasts: Examined lying and sitting.     Right: Without masses, retractions, discharge or axillary adenopathy.     Left: Without masses, retractions, discharge or axillary adenopathy. Gentitourinary   Inguinal/mons:  Normal without inguinal adenopathy  External genitalia:  Normal  BUS/Urethra/Skene's glands:  Normal  Vagina:   Normal  Cervix:  Normal  Uterus:  Anteverted, normal in size, shape and contour.  Midline and mobile  Adnexa/parametria:     Rt: Without masses or tenderness.   Lt: Without masses or tenderness.  Anus and perineum: Normal    Assessment/Plan:  49 y.o.  MBF presents as new patient for evaluation of irregular cycles and hot flashes.   Menopausal symptoms Metorrhagia   Education provided on menopausal symptoms, SBEs, regular exercise, and daily multivitamin.  Recommended scheduling mammogram soon and educated on importance of screening.  Discussed weight management, patient would like nutrition counseling.  Pap smear with high-risk HPV due next year.  Follow-up in 1 year for annual.  Will call with results.    Tamela Gammon [facility], 3:30 PM 05/20/2019

## 2019-05-20 NOTE — Patient Instructions (Addendum)
Multivitamin daily Start exercising daily Follow up in 1 year for annual-we will do Pap smear then Schedule mammogram and colonoscopy  Health Maintenance, Female Adopting a healthy lifestyle and getting preventive care are important in promoting health and wellness. Ask your health care provider about:  The right schedule for you to have regular tests and exams.  Things you can do on your own to prevent diseases and keep yourself healthy. What should I know about diet, weight, and exercise? Eat a healthy diet   Eat a diet that includes plenty of vegetables, fruits, low-fat dairy products, and lean protein.  Do not eat a lot of foods that are high in solid fats, added sugars, or sodium. Maintain a healthy weight Body mass index (BMI) is used to identify weight problems. It estimates body fat based on height and weight. Your health care provider can help determine your BMI and help you achieve or maintain a healthy weight. Get regular exercise Get regular exercise. This is one of the most important things you can do for your health. Most adults should:  Exercise for at least 150 minutes each week. The exercise should increase your heart rate and make you sweat (moderate-intensity exercise).  Do strengthening exercises at least twice a week. This is in addition to the moderate-intensity exercise.  Spend less time sitting. Even light physical activity can be beneficial. Watch cholesterol and blood lipids Have your blood tested for lipids and cholesterol at 47 years of age, then have this test every 5 years. Have your cholesterol levels checked more often if:  Your lipid or cholesterol levels are high.  You are older than 47 years of age.  You are at high risk for heart disease. What should I know about cancer screening? Depending on your health history and family history, you may need to have cancer screening at various ages. This may include screening for:  Breast  cancer.  Cervical cancer.  Colorectal cancer.  Skin cancer.  Lung cancer. What should I know about heart disease, diabetes, and high blood pressure? Blood pressure and heart disease  High blood pressure causes heart disease and increases the risk of stroke. This is more likely to develop in people who have high blood pressure readings, are of African descent, or are overweight.  Have your blood pressure checked: ? Every 3-5 years if you are 29-53 years of age. ? Every year if you are 34 years old or older. Diabetes Have regular diabetes screenings. This checks your fasting blood sugar level. Have the screening done:  Once every three years after age 23 if you are at a normal weight and have a low risk for diabetes.  More often and at a younger age if you are overweight or have a high risk for diabetes. What should I know about preventing infection? Hepatitis B If you have a higher risk for hepatitis B, you should be screened for this virus. Talk with your health care provider to find out if you are at risk for hepatitis B infection. Hepatitis C Testing is recommended for:  Everyone born from [birth year] through 1963.  Anyone with known risk factors for hepatitis C. Sexually transmitted infections (STIs)  Get screened for STIs, including gonorrhea and chlamydia, if: ? You are sexually active and are younger than 47 years of age. ? You are older than 47 years of age and your health care provider tells you that you are at risk for this type of infection. ? Your sexual activity has  changed since you were last screened, and you are at increased risk for chlamydia or gonorrhea. Ask your health care provider if you are at risk.  Ask your health care provider about whether you are at high risk for HIV. Your health care provider may recommend a prescription medicine to help prevent HIV infection. If you choose to take medicine to prevent HIV, you should first get tested for HIV. You should  then be tested every 3 months for as long as you are taking the medicine. Pregnancy  If you are about to stop having your period (premenopausal) and you may become pregnant, seek counseling before you get pregnant.  Take 400 to 800 micrograms (mcg) of folic acid every day if you become pregnant.  Ask for birth control (contraception) if you want to prevent pregnancy. Osteoporosis and menopause Osteoporosis is a disease in which the bones lose minerals and strength with aging. This can result in bone fractures. If you are 60 years old or older, or if you are at risk for osteoporosis and fractures, ask your health care provider if you should:  Be screened for bone loss.  Take a calcium or vitamin D supplement to lower your risk of fractures.  Be given hormone replacement therapy (HRT) to treat symptoms of menopause. Follow these instructions at home: Lifestyle  Do not use any products that contain nicotine or tobacco, such as cigarettes, e-cigarettes, and chewing tobacco. If you need help quitting, ask your health care provider.  Do not use street drugs.  Do not share needles.  Ask your health care provider for help if you need support or information about quitting drugs. Alcohol use  Do not drink alcohol if: ? Your health care provider tells you not to drink. ? You are pregnant, may be pregnant, or are planning to become pregnant.  If you drink alcohol: ? Limit how much you use to 0-1 drink a day. ? Limit intake if you are breastfeeding.  Be aware of how much alcohol is in your drink. In the [place] one drink equals one 12 oz bottle of beer (355 mL), one 5 oz glass of wine (148 mL), or one 1 oz glass of hard liquor (44 mL). General instructions  Schedule regular health, dental, and eye exams.  Stay current with your vaccines.  Tell your health care provider if: ? You often feel depressed. ? You have ever been abused or do not feel safe at home. Summary  Adopting a  healthy lifestyle and getting preventive care are important in promoting health and wellness.  Follow your health care provider's instructions about healthy diet, exercising, and getting tested or screened for diseases.  Follow your health care provider's instructions on monitoring your cholesterol and blood pressure. This information is not intended to replace advice given to you by your health care provider. Make sure you discuss any questions you have with your health care provider. Document Revised: 01/17/2018 Document Reviewed: 01/17/2018 Elsevier Patient Education  2020 [employer].

## 2019-05-21 ENCOUNTER — Other Ambulatory Visit: Payer: Self-pay | Admitting: Nurse Practitioner

## 2019-05-21 ENCOUNTER — Telehealth: Payer: Self-pay

## 2019-05-21 ENCOUNTER — Telehealth: Payer: Self-pay | Admitting: *Deleted

## 2019-05-21 DIAGNOSIS — Z713 Dietary counseling and surveillance: Secondary | ICD-10-CM

## 2019-05-21 DIAGNOSIS — N951 Menopausal and female climacteric states: Secondary | ICD-10-CM

## 2019-05-21 LAB — FOLLICLE STIMULATING HORMONE: FSH: 4.3 m[IU]/mL

## 2019-05-21 MED ORDER — VENLAFAXINE HCL ER 37.5 MG PO CP24: 37.5000 mg | ORAL_CAPSULE | Freq: Every day | ORAL | 2 refills | Status: DC

## 2019-05-21 NOTE — Telephone Encounter (Signed)
-----   Message from Tamela Gammon, NP sent at 05/20/2019  3:58 PM EDT ----- Patient would like nutrition counseling for weight loss management. Thank you!

## 2019-05-21 NOTE — Telephone Encounter (Signed)
-----   Message from Tamela Gammon, NP sent at 05/21/2019  7:59 AM EDT ----- Please call patient about normal [place] and let her know that [facility] can vary a lot during perimenopause causing menopausal symptoms and irregular periods. Vitamin E daily can help with hot flashes. If she is interested we can try a low dose of Effexor-this will help with menopausal symptoms and anxiety that she has been experiencing. Thank you!

## 2019-05-21 NOTE — Telephone Encounter (Signed)
I spoke with patient and read her your result note. She would like to try the Effexor.

## 2019-05-21 NOTE — Telephone Encounter (Signed)
Patient informed. Patient said to tell you thank you so much for listening to her and helping her. She said you were so open to what she was telling you and she really appreciated you.

## 2019-05-21 NOTE — Telephone Encounter (Signed)
Prescription for Effexor 37.5 mg daily sent to her pharmacy. Provided 3 months worth. Please have patient call in a couple of months and if this is working well we will continue it. Thank you.

## 2019-05-21 NOTE — Telephone Encounter (Signed)
Referral placed at [facility] Nutrition they will call to schedule.

## 2019-05-27 NOTE — Telephone Encounter (Signed)
Nutrition office left message for patient to call.

## 2019-06-11 ENCOUNTER — Telehealth: Payer: Self-pay | Admitting: *Deleted

## 2019-06-11 ENCOUNTER — Other Ambulatory Visit: Payer: Self-pay | Admitting: Nurse Practitioner

## 2019-06-11 DIAGNOSIS — N946 Dysmenorrhea, unspecified: Secondary | ICD-10-CM

## 2019-06-11 MED ORDER — NORETHIN ACE-ETH ESTRAD-FE 1-20 MG-MCG PO TABS: 1.0000 | ORAL_TABLET | Freq: Every day | ORAL | 3 refills | Status: DC

## 2019-06-11 NOTE — Telephone Encounter (Signed)
Patient was prescribed Effexor ER 37.5 mg capsule to help with menopausal symptoms patient said medication is helping great with this. However she is having sexual dysfunction states she has no "feeling during sex" and this is not normal for her. She asked about other options? She asked if she take Effexor for symptoms,but add something to help with sex?  Please advise

## 2019-06-20 ENCOUNTER — Other Ambulatory Visit: Payer: Self-pay

## 2019-06-20 ENCOUNTER — Encounter: Payer: Self-pay | Admitting: Dietician

## 2019-06-20 ENCOUNTER — Encounter: Payer: Managed Care, Other (non HMO) | Attending: Nurse Practitioner | Admitting: Dietician

## 2019-06-20 DIAGNOSIS — R635 Abnormal weight gain: Secondary | ICD-10-CM | POA: Diagnosis present

## 2019-06-20 NOTE — Progress Notes (Signed)
Medical Nutrition Therapy   Primary concerns today: weight management   Referral diagnosis: R63.5- abnormal weight gain Preferred learning style: no preference indicated Learning readiness: change in progress   NUTRITION ASSESSMENT   Anthropometrics  Weight: 198 lbs Height: 69 in BMI: 29 kg/m2   Clinical Medications: includes antidepressant  Lifestyle & Dietary Hx Overall, patient eats a well balanced diet and states she does well with monitoring her portion sizes. Does not eat out often and does not skip meals. Typical meal pattern is 3 meals per day plus maybe 1 snack. Eats lots of fish and chicken, very little beef. Does not eat fried foods. Likes vegetables and eats them often. Eats fruit and drinks water throughout the day. States she has been under stress in the past including depression and poor sleep, which she states is better now and has medication to help with these. Perimenopausal.    Estimated daily fluid intake: 8-12 oz juice; 32-64 oz water  Supplements: vitamin E  Sleep: much better since using medication, 6-8 hours  Stress / self-care: previously stressed out a lot, now medication has helped  Current average weekly physical activity: ADLs, knee injury for about a month  24-Hr Dietary Recall First Meal: vegan sausage + egg whites + spinach (or 2 hard boiled eggs)  Snack: - Second Meal: baked chicken (or fish) + brown rice (or wild rice, or ancient grain blend) + asparagus (or brussels sprouts)  Snack: nuts (or sunflower seeds, or pistachios, or fruit)  Third Meal: fish + quinoa + kale (or other veggie)  Snack: - Beverages: cherry juice, beet juice, alkaline water, [place] green tea   Estimated Energy Needs Calories: 1600-2000 Carbohydrate: 180-225g Protein: 100-125g Fat: 53-67g   NUTRITION DIAGNOSIS  Food/nutrition related knowledge deficit (NB-1.1) related to lack or prior nutrition education by a nutrition professional as evidenced by questions raised  regarding balanced healthful eating.    NUTRITION INTERVENTION  Nutrition education (E-1) on the following topics:  . General healthful nutrition- balanced eating, variety of foods/food groups, meal ideas . Nutrition for women- foods/nutrients to support women's health, natural ways to help improve hormonal balance  . Weight management- explanation of BMI and how it is not the best indicator of one's health, other factors that influence weight including genetics, medication use, exercise, stress, sleep, health conditions, nutrition, etc.    Handouts Provided Include   MyPlate Portions + Meal Ideas   Breakfast Ideas  Nutrition for Women - Hormonal Balance  Learning Style & Readiness for Change Teaching method utilized: Visual & Auditory  Demonstrated degree of understanding via: Teach Back  Barriers to learning/adherence to lifestyle change: None Identified    MONITORING & EVALUATION Dietary intake, weekly physical activity, and goals PRN.  Next Steps  Patient is to contact [facility] to schedule follow up as needed/desired. Patient may contact via phone/email in the meantime with questions/concerns.

## 2019-06-20 NOTE — Patient Instructions (Signed)
Estimated Energy Needs per Day   Calories: 1,600-2,000  45% Carbohydrates: 180-225 grams   25% Protein: 100-125 grams   30% Fat: 53-67 grams

## 2019-07-19 ENCOUNTER — Telehealth: Payer: Self-pay | Admitting: Pulmonary Disease

## 2019-07-19 NOTE — Telephone Encounter (Signed)
Spoke to pt, who wishes to switch to [place] office due to living in [place].  Per office protocol- okay to switch due to convenience.   Natoshia can you schedule pt. Thanks

## 2019-07-19 NOTE — Telephone Encounter (Signed)
Pt has been scheduled with Dr. Vaughan Browner on 08/27/2019

## 2019-08-07 ENCOUNTER — Ambulatory Visit (INDEPENDENT_AMBULATORY_CARE_PROVIDER_SITE_OTHER): Payer: Managed Care, Other (non HMO)

## 2019-08-07 ENCOUNTER — Other Ambulatory Visit: Payer: Self-pay

## 2019-08-07 ENCOUNTER — Encounter: Payer: Self-pay | Admitting: Pulmonary Disease

## 2019-08-07 ENCOUNTER — Ambulatory Visit: Payer: Managed Care, Other (non HMO) | Admitting: Pulmonary Disease

## 2019-08-07 VITALS — BP 134/72 | HR 92 | Temp 98.2°F | Ht 69.0 in | Wt 199.4 lb

## 2019-08-07 DIAGNOSIS — R0602 Shortness of breath: Secondary | ICD-10-CM | POA: Diagnosis not present

## 2019-08-07 LAB — CBC WITH DIFFERENTIAL/PLATELET
Basophils Absolute: 0 10*3/uL (ref 0.0–0.1)
Basophils Relative: 0.6 % (ref 0.0–3.0)
Eosinophils Absolute: 0 10*3/uL (ref 0.0–0.7)
Eosinophils Relative: 0.8 % (ref 0.0–5.0)
HCT: 38.2 % (ref 36.0–46.0)
Hemoglobin: 12.6 g/dL (ref 12.0–15.0)
Lymphocytes Relative: 34.1 % (ref 12.0–46.0)
Lymphs Abs: 2.1 10*3/uL (ref 0.7–4.0)
MCHC: 33.1 g/dL (ref 30.0–36.0)
MCV: 90.2 fl (ref 78.0–100.0)
Monocytes Absolute: 0.7 10*3/uL (ref 0.1–1.0)
Monocytes Relative: 11.1 % (ref 3.0–12.0)
Neutro Abs: 3.3 10*3/uL (ref 1.4–7.7)
Neutrophils Relative %: 53.4 % (ref 43.0–77.0)
Platelets: 269 10*3/uL (ref 150.0–400.0)
RBC: 4.23 Mil/uL (ref 3.87–5.11)
RDW: 13.4 % (ref 11.5–15.5)
WBC: 6.1 10*3/uL (ref 4.0–10.5)

## 2019-08-07 MED ORDER — AZELASTINE HCL 0.1 % NA SOLN: 2.0000 | Freq: Two times a day (BID) | NASAL | 5 refills | Status: DC

## 2019-08-07 MED ORDER — FLUTICASONE PROPIONATE 50 MCG/ACT NA SUSP: 2.0000 | Freq: Every day | NASAL | 5 refills | Status: AC

## 2019-08-07 NOTE — Progress Notes (Signed)
Mckynna Vanloan    [id number]    April 16, 1970  Primary Care Physician:Tysinger, Camelia Eng, PA-C  Referring Physician: Carlena Hurl, PA-C [address] [place],  [place] [address]  Chief complaint: Consult for cough, LPR, allergic rhinitis  HPI: 49 year old never smoker previously evaluated at pulmonary office in [facility] for cough felt to be secondary to LPR.  She also has history of perennial allergic rhinitis with postnasal drip.  Treated at that time with Prilosec and montelukast with minimal improvement in symptoms.  She was also evaluated by ENT in 2018 with findings of LPR Transitioning care to [facility] as she lives in this area now  Chief complaint is chronic cough, nonproductive in nature.  Exacerbated by smells, temperature, humidity, spicy food.  This wakes her up at night as well.  She does have postnasal drip.  Denies any overt GERD symptoms. Has occasional burning sensation around the jaw associated with watery eyes, nose, mouth and flushing.  Pets: Dogs, no cats, birds Occupation: Accountant Exposures: Reports mold in the apartment.  May be exposed to asbestos as her husband worked as a Estate manager/land agent up to 2005.  No hot tubs, Jacuzzis or down pillows or comforters Smoking history: Never smoker Travel history: Originally from [place] Relevant family history: No significant family history of lung disease  Outpatient Encounter Medications as of 08/07/2019  Medication Sig  . albuterol (VENTOLIN HFA) 108 (90 Base) MCG/ACT inhaler Inhale 2 puffs into the lungs every 6 (six) hours as needed for shortness of breath (Cough).  Marland Kitchen desonide (DESOWEN) 0.05 % ointment   . ketoconazole (NIZORAL) 2 % cream   . omeprazole (PRILOSEC) 20 MG capsule Take 20 mg by mouth daily.  Marland Kitchen venlafaxine XR (EFFEXOR XR) 37.5 MG 24 hr capsule Take 1 capsule (37.5 mg total) by mouth daily.  . [DISCONTINUED] norethindrone-ethinyl estradiol (LOESTRIN FE) 1-20 MG-MCG tablet Take 1 tablet by  mouth daily. (Patient not taking: Reported on 08/07/2019)   No facility-administered encounter medications on file as of 08/07/2019.    Allergies as of 08/07/2019 - Review Complete 08/07/2019  Allergen Reaction Noted  . Latex Other (See Comments) 07/23/2012    Past Medical History:  Diagnosis Date  . Allergy   . Asthma   . Eczema   . GERD (gastroesophageal reflux disease)   . Migraines    in past, but not currently 12/2017    Past Surgical History:  Procedure Laterality Date  . FOOT SURGERY     left  . HERNIA REPAIR     umbilicus  . TUBAL LIGATION      Family History  Problem Relation Age of Onset  . Cancer Mother 50       breast  . HIV/AIDS Father   . Cancer Maternal Grandmother        bone cancer  . Stroke Maternal Grandmother   . Cancer Maternal Grandfather        prostate cancer  . ADD / ADHD Son   . ADD / ADHD Daughter   . Diabetes Neg Hx   . Heart disease Neg Hx     Social History   Socioeconomic History  . Marital status: Married    Spouse name: Not on file  . Number of children: Not on file  . Years of education: Not on file  . Highest education level: Not on file  Occupational History  . Occupation: Optometrist  Tobacco Use  . Smoking status: Never Smoker  . Smokeless  tobacco: Never Used  Vaping Use  . Vaping Use: Never used  Substance and Sexual Activity  . Alcohol use: Yes    Alcohol/week: 2.0 standard drinks    Types: 2 Shots of liquor per week  . Drug use: No  . Sexual activity: Yes    Birth control/protection: Surgical  Other Topics Concern  . Not on file  Social History Narrative   Married, 4 children, 2 live at home.   Works in Press photographer at [employer], been remote at home for about a year.   Exercise - walking , stretcihng.  04/2019   Social Determinants of Health   Financial Resource Strain:   . Difficulty of Paying Living Expenses:   Food Insecurity:   . Worried About Charity fundraiser in the Last Year:   . Arboriculturist in  the Last Year:   Transportation Needs:   . Film/video editor (Medical):   Marland Kitchen Lack of Transportation (Non-Medical):   Physical Activity:   . Days of Exercise per Week:   . Minutes of Exercise per Session:   Stress:   . Feeling of Stress :   Social Connections:   . Frequency of Communication with Friends and Family:   . Frequency of Social Gatherings with Friends and Family:   . Attends Religious Services:   . Active Member of Clubs or Organizations:   . Attends Archivist Meetings:   Marland Kitchen Marital Status:   Intimate Partner Violence:   . Fear of Current or Ex-Partner:   . Emotionally Abused:   Marland Kitchen Physically Abused:   . Sexually Abused:     Review of systems: Review of Systems  Constitutional: Negative for fever and chills.  HENT: Negative.   Eyes: Negative for blurred vision.  Respiratory: as per HPI  Cardiovascular: Negative for chest pain and palpitations.  Gastrointestinal: Negative for vomiting, diarrhea, blood per rectum. Genitourinary: Negative for dysuria, urgency, frequency and hematuria.  Musculoskeletal: Negative for myalgias, back pain and joint pain.  Skin: Negative for itching and rash.  Neurological: Negative for dizziness, tremors, focal weakness, seizures and loss of consciousness.  Endo/Heme/Allergies: Negative for environmental allergies.  Psychiatric/Behavioral: Negative for depression, suicidal ideas and hallucinations.  All other systems reviewed and are negative.  Physical Exam: Blood pressure 134/72, pulse 92, temperature 98.2 F (36.8 C), temperature source Oral, height 5' 9"  (1.753 m), weight 199 lb 6.4 oz (90.4 kg), SpO2 97 %. Gen:      No acute distress HEENT:  EOMI, sclera anicteric Neck:     No masses; no thyromegaly Lungs:    Clear to auscultation bilaterally; normal respiratory effort CV:         Regular rate and rhythm; no murmurs Abd:      + bowel sounds; soft, non-tender; no palpable masses, no distension Ext:    No edema;  adequate peripheral perfusion Skin:      Warm and dry; no rash Neuro: alert and oriented x 3 Psych: normal mood and affect  Data Reviewed: Imaging:  PFTs: Spirometry 01/11/2018 FVC 2.1 [86%], FEV1 2.5 [85%], F/F 80 Normal  Labs: CBC 04/25/2019-WBC 4.8, eos 1%, absolute eosinophil count 48  Cardiac: Echocardiogram 07/17/2014 LVEF 64%, mild MR with mitral valve prolapse, mild TR  Assessment:  Chronic cough Likely has upper airway cough from postnasal drip, LPR, GERD Low suspicion for asthma but will evaluate her given history of allergies, mold exposure.  She may also have possible asbestos exposure in the past.  Check  CBC differential, respiratory allergy profile, PFTs and chest x-ray If the latter abnormal then get CT chest  In the meantime we will treat postnasal drip and GERD aggressively with Flonase, Astelin and chlorphentermine 3 times daily Increase Prilosec to twice daily Continue albuterol inhaler for now  Plan/Recommendations: CBC, respiratory allergy profile Chest x-ray, PFTs Flonase, Astelin, chlorphentermine 8 mg 3 times daily Increase Prilosec to 20 mg twice daily  This appointment required 50 minutes of patient care (this includes precharting, chart review, review of results, face-to-face care, etc.).  Marshell Garfinkel MD [facility] 08/07/2019, 9:05 AM  CC: Tysinger, Camelia Eng, PA-C

## 2019-08-07 NOTE — Addendum Note (Signed)
Addended by: Suzzanne Cloud E on: 08/07/2019 09:47 AM   Modules accepted: Orders

## 2019-08-07 NOTE — Patient Instructions (Signed)
We will start you on chlorpheniramine 8 mg 3 times a day.  This medication is available over-the-counter.  Side effects to watch out for are excessive sleepiness In addition we will start Flonase and Astelin nasal spray Increase Prilosec to 20 mg twice daily  We will check some labs today including CBC with differential, respiratory allergy profile, chest x-ray and PFTs Return to clinic at next available after these tests to review results.

## 2019-08-08 LAB — RESPIRATORY ALLERGY PROFILE REGION II ~~LOC~~
Allergen, A. alternata, m6: <0.1 kU/L
Allergen, Cedar tree, t12: 0.62 kU/L — ABNORMAL HIGH
Allergen, Comm Silver Birch, t9: 1.54 kU/L — ABNORMAL HIGH
Allergen, Cottonwood, t14: 0.52 kU/L — ABNORMAL HIGH
Allergen, D pternoyssinus,d7: <0.1 kU/L
Allergen, Mouse Urine Protein, e78: <0.1 kU/L
Allergen, Mulberry, t76: 0.28 kU/L — ABNORMAL HIGH
Allergen, Oak,t7: 1.72 kU/L — ABNORMAL HIGH
Allergen, P. notatum, m1: <0.1 kU/L
Aspergillus fumigatus, m3: <0.1 kU/L
Bermuda Grass: 4.23 kU/L — ABNORMAL HIGH
Box Elder IgE: 1.34 kU/L — ABNORMAL HIGH
CLADOSPORIUM HERBARUM (M2) IGE: <0.1 kU/L
COMMON RAGWEED (SHORT) (W1) IGE: 0.23 kU/L — ABNORMAL HIGH
Cat Dander: <0.1 kU/L
Class: 0
Class: 0
Class: 0
Class: 0
Class: 0
Class: 0
Class: 0
Class: 0
Class: 0
Class: 1
Class: 1
Class: 1
Class: 1
Class: 2
Class: 2
Class: 2
Class: 2
Class: 2
Class: 2
Class: 3
Cockroach: <0.1 kU/L
D. farinae: <0.1 kU/L
Dog Dander: 0.13 kU/L — ABNORMAL HIGH
Elm IgE: 0.76 kU/L — ABNORMAL HIGH
IgE (Immunoglobulin E), Serum: 27 kU/L (ref <=114)
Johnson Grass: 1.59 kU/L — ABNORMAL HIGH
Pecan/Hickory Tree IgE: 0.41 kU/L — ABNORMAL HIGH
Rough Pigweed  IgE: 0.42 kU/L — ABNORMAL HIGH
Sheep Sorrel IgE: 0.17 kU/L — ABNORMAL HIGH
Timothy Grass: 2.87 kU/L — ABNORMAL HIGH

## 2019-08-08 LAB — INTERPRETATION:

## 2019-08-20 ENCOUNTER — Telehealth: Payer: Self-pay | Admitting: *Deleted

## 2019-08-20 DIAGNOSIS — N951 Menopausal and female climacteric states: Secondary | ICD-10-CM

## 2019-08-20 MED ORDER — VENLAFAXINE HCL ER 37.5 MG PO CP24: 37.5000 mg | ORAL_CAPSULE | Freq: Every day | ORAL | 3 refills | Status: DC

## 2019-08-20 NOTE — Telephone Encounter (Signed)
Great. Thank you for letting me know!

## 2019-08-20 NOTE — Telephone Encounter (Signed)
Just fyi) Patient called to follow up she stopped the Loestrin 1/20 mg tablet , would like to start back on Effexor XR 37.5 mg 1 po daily. Patient said the Loestrin 1/20 pill did not help with menopausal symptoms at all. She felt good on Effexor and states the benefits of feeling good on Rx out weights the cons to the low sex drive. Per telephone encounter on 05/21/19 if doing well okay to continue.  Rx sent for 90 day supply with refills until annual exam in April 2022.

## 2019-08-27 ENCOUNTER — Ambulatory Visit: Payer: Managed Care, Other (non HMO) | Admitting: Pulmonary Disease

## 2019-09-06 ENCOUNTER — Telehealth: Payer: Self-pay | Admitting: Pulmonary Disease

## 2019-09-06 NOTE — Telephone Encounter (Signed)
Stacey Garfinkel, MD  09/06/2019 6:19 AM EDT     Labs show mild allergies to dog, pollen and grass Chest x ray is normal   LMTCB x 1

## 2019-09-09 NOTE — Telephone Encounter (Signed)
Spoke with pt. She is aware of results. Nothing further was needed.  

## 2019-09-12 ENCOUNTER — Telehealth: Payer: Self-pay | Admitting: Pulmonary Disease

## 2019-09-12 MED ORDER — BENZONATATE 200 MG PO CAPS
200.0000 mg | ORAL_CAPSULE | Freq: Three times a day (TID) | ORAL | 1 refills | Status: DC | PRN
Start: 2019-09-12 — End: 2020-04-27

## 2019-09-12 NOTE — Telephone Encounter (Signed)
Patient contacted with recommendations from [facility]. Patient verbalized understanding of care plan and will call our office for appointment if her symptoms do not improve with the new medication plan. Prescription for Tessalon sent to preferred pharmacy.

## 2019-09-12 NOTE — Telephone Encounter (Signed)
Sorry to hear the regimen is not working  Please add Delsym 2 tsp Twice daily  For cough As needed  (otc)   May have Tessalon Three times a day  For cough As needed   May use together. #30 , 1 refill   Warm heat to pulled muscle   If not better will need ov   Please contact office for sooner follow up if symptoms do not improve or worsen or seek emergency care

## 2019-09-12 NOTE — Telephone Encounter (Signed)
Patient calling today with persistent cough, copious clear secretions, and right sided pain. She reports the cough is so frequent that she may have pulled a muscle on the right side. Last OV 08/07/2019, Dx LPR, on Flonase, Astelin, Prilosec,  Chlorphetemine. Requesting cough medicine. Please advise.

## 2019-10-02 ENCOUNTER — Other Ambulatory Visit: Payer: Self-pay

## 2019-10-02 ENCOUNTER — Telehealth: Payer: Managed Care, Other (non HMO) | Admitting: Medical

## 2019-10-02 DIAGNOSIS — R070 Pain in throat: Secondary | ICD-10-CM | POA: Diagnosis not present

## 2019-10-02 DIAGNOSIS — R059 Cough, unspecified: Secondary | ICD-10-CM

## 2019-10-02 DIAGNOSIS — R432 Parageusia: Secondary | ICD-10-CM

## 2019-10-02 DIAGNOSIS — J3489 Other specified disorders of nose and nasal sinuses: Secondary | ICD-10-CM

## 2019-10-02 DIAGNOSIS — R05 Cough: Secondary | ICD-10-CM | POA: Diagnosis not present

## 2019-10-02 DIAGNOSIS — R1310 Dysphagia, unspecified: Secondary | ICD-10-CM

## 2019-10-02 DIAGNOSIS — H04203 Unspecified epiphora, bilateral lacrimal glands: Secondary | ICD-10-CM

## 2019-10-02 NOTE — Progress Notes (Signed)
Order placed for swallow study

## 2019-10-02 NOTE — Progress Notes (Signed)
Na

## 2019-10-02 NOTE — Progress Notes (Signed)
Subjective:     Patient ID: Stacey Hart, female   DOB: Apr 01, 1970, 49 y.o.   MRN: [id number]  This visit type was conducted due to national recommendations for restrictions regarding the COVID-19 Pandemic (e.g. social distancing) in an effort to limit this patient's exposure and mitigate transmission in our community.  Due to their co-morbid illnesses, this patient is at least at moderate risk for complications without adequate follow up.  This format is felt to be most appropriate for this patient at this time.    Documentation for virtual audio and video telecommunications through [place] encounter:  The patient was located at home. The provider was located in the office. The patient did consent to this visit and is aware of possible charges through their insurance for this visit.  The other persons participating in this telemedicine service were none. Time spent on call was 20 minutes and in review of previous records 20 minutes total.  This virtual service is not related to other E/M service within previous 7 days.   HPI Chief Complaint  Patient presents with  . Referral    NEUROLOGIST  . Cough   Virtual consult today for cough and other symptoms she has had since 2019.  She notes she gets episodes of burning sensation under her chin and jaw, tingling in one side of the face.  This can either trigger or seem to come after coughing.  But they seem to often be together.  When she gets these episodes she also gets runny nose and tearing of the eye on the same side as the burning and tingling under her chin.  Sometimes she coughs so bad that she throws up.  She feels nauseous sometimes.  She does note hoarse voice at times.  The sensations also can be described as extreme gagging.  She lost her taste and smell sensation in June 19 of this year.  That this gradually came back.  She had a negative Covid test during that time.  She denies weight loss, no fever, no body aches or chills.   She denies headaches unless she has a lot of coughing that can sometimes trigger headaches.  She has seen both pulmonology and gastroenterology for the same symptoms.  After several visits they felt like she should go see neurology.  However she has not had any endoscopy, no swallow study, no scans.  She has been tried on various medications including reflux medications.  Her current weight is 195 pounds.  She says this is stable for her.  She eats a variety of fruits and vegetables.  She does not drink alcohol but occasionally, non-smoker.  Past Medical History:  Diagnosis Date  . Allergy   . Asthma   . Eczema   . GERD (gastroesophageal reflux disease)   . Migraines    in past, but not currently 12/2017   Current Outpatient Medications on File Prior to Visit  Medication Sig Dispense Refill  . albuterol (VENTOLIN HFA) 108 (90 Base) MCG/ACT inhaler Inhale 2 puffs into the lungs every 6 (six) hours as needed for shortness of breath (Cough). 18 g 0  . azelastine (ASTELIN) 0.1 % nasal spray Place 2 sprays into both nostrils 2 (two) times daily. Use in each nostril as directed 30 mL 5  . benzonatate (TESSALON) 200 MG capsule Take 1 capsule (200 mg total) by mouth 3 (three) times daily as needed for cough. 30 capsule 1  . desonide (DESOWEN) 0.05 % ointment     . famotidine (  PEPCID) 40 MG tablet Take 40 mg by mouth 2 (two) times daily.    . fluticasone (FLONASE) 50 MCG/ACT nasal spray Place 2 sprays into both nostrils daily. 16 g 5  . ketoconazole (NIZORAL) 2 % cream     . venlafaxine XR (EFFEXOR XR) 37.5 MG 24 hr capsule Take 1 capsule (37.5 mg total) by mouth daily. 90 capsule 3  . omeprazole (PRILOSEC) 20 MG capsule Take 20 mg by mouth daily. (Patient not taking: Reported on 10/02/2019)     No current facility-administered medications on file prior to visit.    Review of Systems As in subjective     Objective:   Physical Exam Due to coronavirus pandemic stay at home measures,  patient visit was virtual and they were not examined in person.   Wt Readings from Last 3 Encounters:  08/07/19 199 lb 6.4 oz (90.4 kg)  06/20/19 198 lb (89.8 kg)  05/20/19 198 lb (89.8 kg)        Assessment:     Encounter Diagnoses  Name Primary?  . Burning sensation of throat Yes  . Dysphagia, unspecified type   . Cough   . Tearing eyes   . Rhinorrhea   . Taste impairment        Plan:     We discussed possible causes of her symptoms.  The differential was quite varied.  She has a history of known laryngo pharyngeal reflux, but despite various treatments including allergy medicine, acid reflux medicine, cough medicine, not seeing improvements.  Since the symptoms can rotate from side to side and given no other obvious neurological issues or deficits not so sure neurology is the right referral yet.  I am going to refer her for swallow study.  Likely referral to speech therapy.  Consider still having neurological consult after we have pursued the above first.  I reviewed over her chart records, last labs in the chart earlier this year.  No additional labs at this time.  Latitia was seen today for referral and cough.  Diagnoses and all orders for this visit:  Burning sensation of throat -     DG SWALLOW STUDY OP MEDICARE SPEECH PATH; Future  Dysphagia, unspecified type -     DG SWALLOW STUDY OP MEDICARE SPEECH PATH; Future  Cough -     DG SWALLOW STUDY OP MEDICARE SPEECH PATH; Future  Tearing eyes  Rhinorrhea  Taste impairment

## 2019-10-05 ENCOUNTER — Other Ambulatory Visit (HOSPITAL_COMMUNITY): Payer: Managed Care, Other (non HMO)

## 2019-10-08 ENCOUNTER — Other Ambulatory Visit (HOSPITAL_COMMUNITY): Payer: Self-pay

## 2019-10-08 ENCOUNTER — Other Ambulatory Visit: Payer: Self-pay

## 2019-10-08 ENCOUNTER — Ambulatory Visit: Payer: Managed Care, Other (non HMO) | Admitting: Pulmonary Disease

## 2019-10-08 DIAGNOSIS — R070 Pain in throat: Secondary | ICD-10-CM

## 2019-10-08 DIAGNOSIS — R1310 Dysphagia, unspecified: Secondary | ICD-10-CM

## 2019-10-08 DIAGNOSIS — R432 Parageusia: Secondary | ICD-10-CM

## 2019-10-17 ENCOUNTER — Other Ambulatory Visit: Payer: Self-pay

## 2019-10-17 ENCOUNTER — Ambulatory Visit (HOSPITAL_COMMUNITY)
Admission: RE | Admit: 2019-10-17 | Discharge: 2019-10-17 | Disposition: A | Discharge status: Home / Self Care | Payer: Managed Care, Other (non HMO) | Source: Ambulatory Visit | Attending: Medical | Admitting: Medical

## 2019-10-17 DIAGNOSIS — R070 Pain in throat: Secondary | ICD-10-CM | POA: Insufficient documentation

## 2019-10-17 DIAGNOSIS — R432 Parageusia: Secondary | ICD-10-CM | POA: Diagnosis not present

## 2019-10-17 DIAGNOSIS — R1310 Dysphagia, unspecified: Secondary | ICD-10-CM | POA: Insufficient documentation

## 2019-10-17 DIAGNOSIS — R1312 Dysphagia, oropharyngeal phase: Secondary | ICD-10-CM

## 2019-10-17 NOTE — Therapy (Signed)
Modified Barium Swallow Progress Note  Patient Details  Name: Christle Nolting MRN: [id number] Date of Birth: 05-17-1970  Today's Date: 10/17/2019  Modified Barium Swallow completed.  Full report located under Chart Review in the Imaging Section.  Brief recommendations include the following:  Clinical Impression  Pt presents with normal oropharyngeal swallow. Oral prep and propulsion are timely. No delay in swallow reflex, no post-swallow residue or penetration/aspiration on any consistency. Esophageal sweep revealed it to be clear. No obvious anatomical issue noted, however, no radiologist was present to confirm.   Swallow Evaluation Recommendations  SLP Diet Recommendations: Regular solids;Thin liquid   Liquid Administration via: Cup;Straw   Medication Administration: Whole meds with liquid   Supervision: Patient able to self feed       Postural Changes: Remain semi-upright after after feeds/meals (Comment);Seated upright at 90 degrees   Oral Care Recommendations: Oral care BID     Daenerys Buttram B. Quentin Ore, [facility], [place] Speech Language Pathologist Office: [phone] Pager: [phone]  Shonna Chock 10/17/2019,1:04 PM

## 2019-10-22 ENCOUNTER — Other Ambulatory Visit: Payer: Self-pay

## 2019-10-22 DIAGNOSIS — R070 Pain in throat: Secondary | ICD-10-CM

## 2019-10-22 DIAGNOSIS — R200 Anesthesia of skin: Secondary | ICD-10-CM

## 2019-10-23 ENCOUNTER — Telehealth: Payer: Self-pay | Admitting: Medical

## 2019-10-23 NOTE — Telephone Encounter (Signed)
See if we can try different neurology office.   Also what are current symptoms?  Any new symptoms or same as when I talked with her?

## 2019-10-23 NOTE — Telephone Encounter (Signed)
  Please call re Neuro referral They can't see her until November, pt wants to know if we could refer to different office. I did advise her that it normally takes awhile to get scheduled with neuro. She wanted to know what you could do for her States she is struggling

## 2019-10-24 NOTE — Telephone Encounter (Signed)
Patient has been referred to [facility] and is having the same symptoms as before.

## 2019-10-25 NOTE — Telephone Encounter (Signed)
Continue plan to get her into neurology or different neurology sooner.  She is already on acid blockers and acid reflux medication.  I think she has already tried allergy pills.    Thus, I don't know what else to try for the unusual symptoms  If this if the symptoms continue to be more of a numbness sensation, we could try a neuropathic medicine such as gabapentin but not sure that this would take care of it

## 2019-12-19 ENCOUNTER — Other Ambulatory Visit: Payer: Self-pay

## 2019-12-19 DIAGNOSIS — R200 Anesthesia of skin: Secondary | ICD-10-CM

## 2019-12-19 DIAGNOSIS — R070 Pain in throat: Secondary | ICD-10-CM

## 2019-12-20 ENCOUNTER — Encounter: Payer: Self-pay | Admitting: Neurology

## 2019-12-20 ENCOUNTER — Ambulatory Visit: Payer: Managed Care, Other (non HMO) | Admitting: Neurology

## 2019-12-20 ENCOUNTER — Other Ambulatory Visit: Payer: Self-pay

## 2019-12-20 VITALS — BP 150/91 | HR 99 | Ht 69.0 in | Wt 209.0 lb

## 2019-12-20 DIAGNOSIS — R059 Cough, unspecified: Secondary | ICD-10-CM

## 2019-12-20 DIAGNOSIS — R070 Pain in throat: Secondary | ICD-10-CM | POA: Diagnosis not present

## 2019-12-20 MED ORDER — CLONAZEPAM 0.5 MG PO TABS: 0.5000 mg | ORAL_TABLET | Freq: Two times a day (BID) | ORAL | 3 refills | Status: DC

## 2019-12-20 NOTE — Progress Notes (Signed)
Reason for visit: Burning in the throat, frequent coughing and choking  Referring physician: [name] is a 49 y.o. female  History of present illness:  Stacey Hart is a 49 year old right-handed black female with a 5-year history of problems with choking and coughing.  The patient has had gradual worsening of frequency and severity of the events, she indicates that currently she is having up to 20-30 episodes daily where she will begin coughing, she will have increased saliva production and tearing.  She may have a burning sensation and tingling on one side of the throat or the other.  She denies that any particular foods that bring on the choking, she may have episodes without eating.  She also has episodes throughout the night that keep her awake.  She is not resting well.  The patient has had episodes that seem like asthma, she was told that she had gastroesophageal reflux disease.  She has been seen through an ENT physician who felt that she had laryngeal spasm.  The patient has a normal voice pattern, and no alteration in her ability to speak.  The patient recently in the last 2 to 3 months has noted some intermittent facial twitching on the left greater than right side of the face.  The patient denies any weakness, she may have migratory numbness in the arms and legs and feet.  She does have headaches off and on and some slight dizziness.  Given the on going symptoms, the patient is referred here for further evaluation.  She did have a modified barium swallow that was unremarkable.  Past Medical History:  Diagnosis Date  . Allergy   . Asthma   . Eczema   . GERD (gastroesophageal reflux disease)   . Migraines    in past, but not currently 12/2017    Past Surgical History:  Procedure Laterality Date  . FOOT SURGERY     left  . HERNIA REPAIR     umbilicus  . TUBAL LIGATION      Family History  Problem Relation Age of Onset  . Cancer Mother 78       breast   . HIV/AIDS Father   . Cancer Maternal Grandmother        bone cancer  . Stroke Maternal Grandmother   . Cancer Maternal Grandfather        prostate cancer  . ADD / ADHD Son   . ADD / ADHD Daughter   . Diabetes Neg Hx   . Heart disease Neg Hx     Social history:  reports that she has never smoked. She has never used smokeless tobacco. She reports current alcohol use of about 2.0 standard drinks of alcohol per week. She reports that she does not use drugs.  Medications:  Prior to Admission medications   Medication Sig Start Date End Date Taking? Authorizing Provider  albuterol (VENTOLIN HFA) 108 (90 Base) MCG/ACT inhaler Inhale 2 puffs into the lungs every 6 (six) hours as needed for shortness of breath (Cough). 10/03/18  Yes Tyler Pita, MD  azelastine (ASTELIN) 0.1 % nasal spray Place 2 sprays into both nostrils 2 (two) times daily. Use in each nostril as directed 08/07/19  Yes Mannam, Praveen, MD  benzonatate (TESSALON) 200 MG capsule Take 1 capsule (200 mg total) by mouth 3 (three) times daily as needed for cough. 09/12/19  Yes Parrett, Fonnie Mu, NP  desonide (DESOWEN) 0.05 % ointment  03/08/19  Yes [provider]  famotidine (PEPCID) 40 MG tablet Take 40 mg by mouth 2 (two) times daily. 09/18/19  Yes [provider]  fluticasone (FLONASE) 50 MCG/ACT nasal spray Place 2 sprays into both nostrils daily. 08/07/19  Yes Mannam, Praveen, MD  ketoconazole (NIZORAL) 2 % cream  03/08/19  Yes [provider]  omeprazole (PRILOSEC) 20 MG capsule Take 20 mg by mouth daily.    Yes [provider]  venlafaxine XR (EFFEXOR XR) 37.5 MG 24 hr capsule Take 1 capsule (37.5 mg total) by mouth daily. 08/20/19 08/19/20 Yes Tamela Gammon, NP      Allergies  Allergen Reactions  . Latex Other (See Comments)    VAGINAL AREA - BURNING AND ITCHING    ROS:  Out of a complete 14 system review of symptoms, the patient complains only of the following symptoms, and all  other reviewed systems are negative.  Frequent coughing and choking Burning and tingling in the throat  Blood pressure (!) 150/91, pulse 99, height 5' 9"  (1.753 m), weight 209 lb (94.8 kg).  Physical Exam  General: The patient is alert and cooperative at the time of the examination.  Eyes: Pupils are equal, round, and reactive to light. Discs are flat bilaterally.  Neck: The neck is supple, no carotid bruits are noted.  Respiratory: The respiratory examination is clear.  Cardiovascular: The cardiovascular examination reveals a regular rate and rhythm, no obvious murmurs or rubs are noted.  Skin: Extremities are without significant edema.  Neurologic Exam  Mental status: The patient is alert and oriented x 3 at the time of the examination. The patient has apparent normal recent and remote memory, with an apparently normal attention span and concentration ability.  Cranial nerves: Facial symmetry is present. There is good sensation of the face to pinprick and soft touch bilaterally. The strength of the facial muscles and the muscles to head turning and shoulder shrug are normal bilaterally. Speech is well enunciated, no aphasia or dysarthria is noted. Extraocular movements are full. Visual fields are full. The tongue is midline, and the patient has symmetric elevation of the soft palate. No obvious hearing deficits are noted.  Motor: The motor testing reveals 5 over 5 strength of all 4 extremities. Good symmetric motor tone is noted throughout.  Sensory: Sensory testing is intact to pinprick, soft touch, vibration sensation, and position sense on all 4 extremities. No evidence of extinction is noted.  Coordination: Cerebellar testing reveals good finger-nose-finger and heel-to-shin bilaterally.  Gait and station: Gait is normal. Tandem gait is normal. Romberg is negative. No drift is seen.  Reflexes: Deep tendon reflexes are symmetric and normal bilaterally. Toes are downgoing  bilaterally.   Assessment/Plan:  1.  Reported frequent coughing and choking, burning in the throat   2.  History of gastroesophageal reflux disease  The neurologic examination today is completely normal.  The patient will be treated with clonazepam 0.5 mg twice daily for the episodes above.  If no benefit is noted, we may pursue MRI of the brain in the future.  There may be some underlying anxiety issues associated with the above events.  Jill Alexanders MD 12/20/2019 11:20 AM  [facility] [address] [place], [place] [address]  Phone [phone] Fax [phone]

## 2019-12-24 ENCOUNTER — Telehealth: Payer: Self-pay | Admitting: Neurology

## 2019-12-24 DIAGNOSIS — R1312 Dysphagia, oropharyngeal phase: Secondary | ICD-10-CM

## 2019-12-24 NOTE — Telephone Encounter (Signed)
Pt is asking for a call from RN to discuss questions she has about her clonazePAM (KLONOPIN) 0.5 MG tablet

## 2019-12-25 ENCOUNTER — Encounter: Payer: Self-pay | Admitting: Medical

## 2019-12-25 ENCOUNTER — Telehealth: Payer: Managed Care, Other (non HMO) | Admitting: Medical

## 2019-12-25 ENCOUNTER — Other Ambulatory Visit: Payer: Self-pay

## 2019-12-25 VITALS — BP 138/78 | Temp 98.1°F | Wt 209.0 lb

## 2019-12-25 DIAGNOSIS — K219 Gastro-esophageal reflux disease without esophagitis: Secondary | ICD-10-CM

## 2019-12-25 DIAGNOSIS — F419 Anxiety disorder, unspecified: Secondary | ICD-10-CM | POA: Insufficient documentation

## 2019-12-25 DIAGNOSIS — R1312 Dysphagia, oropharyngeal phase: Secondary | ICD-10-CM

## 2019-12-25 DIAGNOSIS — R059 Cough, unspecified: Secondary | ICD-10-CM | POA: Diagnosis not present

## 2019-12-25 DIAGNOSIS — R070 Pain in throat: Secondary | ICD-10-CM

## 2019-12-25 MED ORDER — BUPROPION HCL ER (XL) 150 MG PO TB24: 150.0000 mg | ORAL_TABLET | Freq: Every day | ORAL | 1 refills | Status: DC

## 2019-12-25 MED ORDER — DEXLANSOPRAZOLE 60 MG PO CPDR: 60.0000 mg | DELAYED_RELEASE_CAPSULE | Freq: Every day | ORAL | 1 refills | Status: DC

## 2019-12-25 MED ORDER — FAMOTIDINE 40 MG PO TABS: 40.0000 mg | ORAL_TABLET | Freq: Every day | ORAL | 1 refills | Status: DC

## 2019-12-25 NOTE — Patient Instructions (Signed)
RESOURCES in [place], [place]  If you are experiencing a mental health crisis or an emergency, please call 911 or go to the nearest emergency department.  [facility]   [phone] [facility]  [phone] [facility]   [phone]  Suicide Hotline 1-800-Suicide 581-398-8287)  National Suicide Prevention Lifeline 848-526-8953  740-751-2802)  Domestic Violence, Rape/Crisis - [facility] [place] ([phone]  The [employer] Violence Hotline 1-800-799-SAFE 939-104-0439)  To report Child or Elder Abuse, please call: [facility] Police Department  [phone] [facility] Department  [phone]  Teen Crisis line 737-710-1857 or (250)422-0006       Counseling Services (NON- psychiatrist offices)  Dr. George Hugh, [place] [phone] [place], [place] [address]   [place] [address] [place], [place] [address] [phone]   [facility] [phone]  [email or web address] [address] [place], [place], [place] [address]   Merrianne M. Clarene Reamer, therapist [phone] [address] [place], [place] [address]   [facility] [phone] [address], [place], [place] [address]   Vic Ripper, therapist [phone] [address], [place], [place] [address]   [facility] [phone] [address] [place], [place], [place] [address]   [facility] [place] [facility] [address] [place], [place] [address] Crisis services, Family support, in home therapy, treatment for Anxiety, PTSD, Sexual Assault, Substance Abuse, Financial/Credit Counseling, Variety of other services

## 2019-12-25 NOTE — Telephone Encounter (Signed)
At this point I would recommend to follow-up with the primary care to discuss more aggressive management of reflux and also evaluation for the possibility of underlying anxiety as Dr. Jannifer Franklin indicated.  He mentioned a possibility for a brain MRI.  I would like for him to put this request in, if he still would like to pursue this, when he is back.   Please advise patient of the above.

## 2019-12-25 NOTE — Telephone Encounter (Signed)
I tried to call patient, unable to leave a message, I will have my nurse call her tomorrow.  The patient is to go up on the clonazepam taking 0.5 mg in the morning and 1 mg in the evening.  I will go ahead and check MRI of the brain.

## 2019-12-25 NOTE — Progress Notes (Signed)
done

## 2019-12-25 NOTE — Telephone Encounter (Signed)
Called patient back and discussed Dr. Guadelupe Sabin recommendations.  Patient verbalized she will call her PCP to look into reflux/GERD as well as the possibility of underlying anxiety playing a role in the continued coughing.  She will not increase or change dosing of the clonazepam, she said if it isn't working she will not continue to take it and discuss it further with the PCP.    Did you want to order the MRI?  Please advise.

## 2019-12-25 NOTE — Addendum Note (Signed)
Addended by: Kathrynn Ducking on: 12/25/2019 05:28 PM   Modules accepted: Orders

## 2019-12-25 NOTE — Progress Notes (Signed)
This visit type was conducted due to national recommendations for restrictions regarding the COVID-19 Pandemic (e.g. social distancing) in an effort to limit this patient's exposure and mitigate transmission in our community.  Due to their co-morbid illnesses, this patient is at least at moderate risk for complications without adequate follow up.  This format is felt to be most appropriate for this patient at this time.    Documentation for virtual audio and video telecommunications through [place] encounter:  The patient was located at home. The provider was located in the office. The patient did consent to this visit and is aware of possible charges through their insurance for this visit.  The other persons participating in this telemedicine service were none. Time spent on call was 20 minutes and in review of previous records >15 minutes total.  This virtual service is not related to other E/M service within previous 7 days.   Subjective:  Stacey Hart is a 49 y.o. female who presents for Chief Complaint  Patient presents with  . other    discuss medicine discuss clonazepam and effexor and aslo discuss stronger acid medication, omeprazole and pepcid don't work for her.     Virtual consult to follow-up on recent visit.  She has had ongoing cough, laryngeal pharyngeal reflux, and has seen a few different specialists and had a swallow study recently for the same.  She recently just saw neurology. [facility] last Friday, Dr .Jannifer Hart.   Was prescribed Clonazepam 0.80m, one twice daily.  Started this 5 days ago, makes her a little sleepy.  The first 2 days ok, but starting yesterday was getting a cough.  The same level of cough has returned.   Called neurology back.   They think her real issue is the need for more intensive treatment for anxiety.  They feel her laryngospasm are related to anxiety.  She has Silent reflux.  Currently using Omeprazole 451mBID.. [place] KitchenShe wants to change from  Effexor to Wellbutrin.  She has sexual side effects with Effexor.    She does feel that she has a lot of stressors and anxiety.  She has considered counseling.  No other aggravating or relieving factors.    No other c/o.  Past Medical History:  Diagnosis Date  . Allergy   . Asthma   . Eczema   . GERD (gastroesophageal reflux disease)   . Migraines    in past, but not currently 12/2017     The following portions of the patient's history were reviewed and updated as appropriate: allergies, current medications, past family history, past medical history, past social history, past surgical history and problem list.  ROS Otherwise as in subjective above  Objective: BP 138/78   Temp 98.1 F (36.7 C)   Wt 209 lb (94.8 kg)   BMI 30.86 kg/m   General appearance: alert, no distress, well developed, well nourished Not examined in person as this was a virtual consult   Assessment: Encounter Diagnoses  Name Primary?  . [place] Kitchennxiety Yes  . Cough   . Burning sensation of throat   . Oropharyngeal dysphagia   . Laryngopharyngeal reflux      Plan: I reviewed her recent neurology consult notes as well as pulmonology notes from a few months ago.  We discussed her recent symptoms.  Her gynecologist and neurologist feel strongly that anxiety is playing a large role in her symptoms.  We will stop Effexor and change to Wellbutrin.  Discussed risk and benefits and proper use  of medication.  She was recently prescribed benzodiazepine by neurology.  She will use this as needed.  She will begin Dexilant and add back Pepcid at night.  She will stop the over-the-counter PPI she is using  I gave her a list of counselors to consider for counseling   Stacey Hart was seen today for other.  Diagnoses and all orders for this visit:  Anxiety  Cough  Burning sensation of throat  Oropharyngeal dysphagia  Laryngopharyngeal reflux  Other orders -     Discontinue: dexlansoprazole (DEXILANT) 60  MG capsule; Take 1 capsule (60 mg total) by mouth daily. -     Discontinue: famotidine (PEPCID) 40 MG tablet; Take 1 tablet (40 mg total) by mouth daily. -     Discontinue: buPROPion (WELLBUTRIN XL) 150 MG 24 hr tablet; Take 1 tablet (150 mg total) by mouth daily. -     dexlansoprazole (DEXILANT) 60 MG capsule; Take 1 capsule (60 mg total) by mouth daily. -     famotidine (PEPCID) 40 MG tablet; Take 1 tablet (40 mg total) by mouth daily. -     buPROPion (WELLBUTRIN XL) 150 MG 24 hr tablet; Take 1 tablet (150 mg total) by mouth daily.    Follow up: 47mo

## 2019-12-25 NOTE — Telephone Encounter (Signed)
Called patient and she stated she started the 0.5 clonazepam twice a day over the weekend and it seemed to help.  By Tuesday, 12/24/19, it isn't working anymore and she is coughing like she was before she started taking.  She feels like she is back at square one.  She is asking if she should increase the dosage/frequency or is there something else she can try to see if it will help her?

## 2019-12-26 ENCOUNTER — Telehealth: Payer: Self-pay | Admitting: Neurology

## 2019-12-26 ENCOUNTER — Ambulatory Visit: Payer: Managed Care, Other (non HMO) | Admitting: Neurology

## 2019-12-26 NOTE — Telephone Encounter (Signed)
Reached patient by phone, went over Dr. Tobey Grim recommendations and increase of clonazepam in the evening to 2 pills, patient verbalized understanding.  Patient also verbalized that somebody will be contacting her for scheduling of the MRI once approved through her insurance company.  Patient had no further questions and expressed appreciation.

## 2019-12-26 NOTE — Telephone Encounter (Signed)
Pt returned phone call.

## 2019-12-26 NOTE — Telephone Encounter (Signed)
Attempted to call patient, mail box was full, unable to leave message.  Will try again later.

## 2019-12-26 NOTE — Telephone Encounter (Signed)
[employer] order sent to GI. They will obtain the auth and reach out to the patient to schedule.

## 2019-12-27 ENCOUNTER — Other Ambulatory Visit: Payer: Self-pay | Admitting: Neurology

## 2020-01-01 ENCOUNTER — Ambulatory Visit
Admission: RE | Admit: 2020-01-01 | Discharge: 2020-01-01 | Disposition: A | Discharge status: Home / Self Care | Payer: Managed Care, Other (non HMO) | Source: Ambulatory Visit | Attending: Neurology | Admitting: Neurology

## 2020-01-01 DIAGNOSIS — R1312 Dysphagia, oropharyngeal phase: Secondary | ICD-10-CM

## 2020-01-05 ENCOUNTER — Encounter (INDEPENDENT_AMBULATORY_CARE_PROVIDER_SITE_OTHER): Payer: Self-pay

## 2020-01-05 ENCOUNTER — Telehealth: Payer: Self-pay | Admitting: Neurology

## 2020-01-05 NOTE — Telephone Encounter (Signed)
  I called the patient.  MRI brain shows several microhemorrhages, nothing that ought to be causing difficulty with coughing or swallowing issues.  I discussed this with the patient.  The patient is doing better on clonazepam.   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  - Possible vascular malformation / developmental venous anomaly in the right frontal centrum semiovale. - No acute findings.

## 2020-01-07 ENCOUNTER — Other Ambulatory Visit: Payer: Self-pay | Admitting: Neurology

## 2020-01-07 MED ORDER — CLONAZEPAM 0.5 MG PO TABS: 0.5000 mg | ORAL_TABLET | Freq: Three times a day (TID) | ORAL | 3 refills | Status: DC | PRN

## 2020-02-04 ENCOUNTER — Encounter: Payer: Self-pay | Admitting: Medical

## 2020-02-04 ENCOUNTER — Other Ambulatory Visit: Payer: Self-pay

## 2020-02-04 ENCOUNTER — Telehealth: Payer: Managed Care, Other (non HMO) | Admitting: Medical

## 2020-02-04 ENCOUNTER — Telehealth: Payer: Self-pay | Admitting: Medical

## 2020-02-04 ENCOUNTER — Other Ambulatory Visit (INDEPENDENT_AMBULATORY_CARE_PROVIDER_SITE_OTHER): Payer: Managed Care, Other (non HMO)

## 2020-02-04 VITALS — BP 124/91 | HR 101 | Temp 100.0°F | Wt 209.0 lb

## 2020-02-04 DIAGNOSIS — R509 Fever, unspecified: Secondary | ICD-10-CM

## 2020-02-04 DIAGNOSIS — R430 Anosmia: Secondary | ICD-10-CM

## 2020-02-04 DIAGNOSIS — R059 Cough, unspecified: Secondary | ICD-10-CM

## 2020-02-04 LAB — POC COVID19 BINAXNOW: SARS Coronavirus 2 Ag: NEGATIVE

## 2020-02-04 LAB — POCT INFLUENZA A/B
Influenza A, POC: NEGATIVE
Influenza B, POC: NEGATIVE

## 2020-02-04 MED ORDER — GUAIFENESIN-DM 100-10 MG/5ML PO SYRP: 5.0000 mL | ORAL_SOLUTION | ORAL | 0 refills | Status: DC | PRN

## 2020-02-04 MED ORDER — ALBUTEROL SULFATE HFA 108 (90 BASE) MCG/ACT IN AERS: 2.0000 | INHALATION_SPRAY | Freq: Four times a day (QID) | RESPIRATORY_TRACT | 0 refills | Status: DC | PRN

## 2020-02-04 MED ORDER — EMERGEN-C IMMUNE PLUS PO PACK: 1.0000 | PACK | Freq: Two times a day (BID) | ORAL | 0 refills | Status: DC

## 2020-02-04 NOTE — Progress Notes (Signed)
done

## 2020-02-04 NOTE — Progress Notes (Signed)
Subjective:     Patient ID: Stacey Hart, female   DOB: [birth year], 49 y.o.   MRN: [id number]  This visit type was conducted due to national recommendations for restrictions regarding the COVID-19 Pandemic (e.g. social distancing) in an effort to limit this patient's exposure and mitigate transmission in our community.  Due to their co-morbid illnesses, this patient is at least at moderate risk for complications without adequate follow up.  This format is felt to be most appropriate for this patient at this time.    Documentation for virtual audio and video telecommunications through [place] encounter:  The patient was located at home. The provider was located in the office. The patient did consent to this visit and is aware of possible charges through their insurance for this visit.  The other persons participating in this telemedicine service were none. Time spent on call was 20 minutes and in review of previous records 20 minutes total.  This virtual service is not related to other E/M service within previous 7 days.   HPI Chief Complaint  Patient presents with  . sick    Fever- 100.0, headache, runny , cough, loss of smell, sore throat, loss of taste, symptoms started yesterday.    Virtual consult for illness.   Started having symptoms yesterday with running nose, sore throat, sneezing, cough.   This morning awoke, had whole head congestion, headaches, coughing, still doesn't feel great.   Using bc powders for headaches.  Has loss smell and taste.  usings vicks varpor rub topically under nose, can't smell that.  Not body aches or chills.  No fever so far but feel warm.  No NVD.  No sick contacts.  Used some nasal spray yesterday.   Is fatigued but stays this way with hier clonazepam.  No sob.  No wheezing.    Has not had the vaccine  No other aggravating or relieving factors. No other complaint.  Past Medical History:  Diagnosis Date  . Allergy   . Asthma   . Eczema   .  GERD (gastroesophageal reflux disease)   . Migraines    in past, but not currently 12/2017    Review of Systems As in subjective    Objective:   Physical Exam Due to coronavirus pandemic stay at home measures, patient visit was virtual and they were not examined in person.   BP (!) 124/91   Pulse (!) 101   Temp 100 F (37.8 C)   Wt 209 lb (94.8 kg)   BMI 30.86 kg/m   Gen: wd, wn, nad, sounds stuffy No obvious wheezing or sob     Assessment:     Encounter Diagnoses  Name Primary?  . Cough Yes  . Loss of smell   . Fever, unspecified fever cause        Plan:     We discussed her symptoms and concerns which sounds suspicious for Covid infection.  General recommendations: I recommend you rest, hydrate well with water and clear fluids throughout the day.   You can use Tylenol for pain or fever instead of the [facility] powders You can use over the counter Robitussin DM for cough/congestion. You can use over the counter Emetrol for nausea.    Begin [employer] immune plus vitamin pack over the counter Use your inhaler as needed for wheezing or shortness of breath  If you are having trouble breathing, if you are very weak, have high fever 103 or higher consistently despite Tylenol, or uncontrollable nausea and vomiting,  then call or go to the emergency department.    If you have other questions or have other symptoms or questions you are concerned about then please call back  Covid symptoms such as fatigue and cough can linger over 2 weeks, even after the initial fever, aches, chills, and other initial symptoms.   Self Quarantine: The CDC, Centers for Disease Control has recommended a self quarantine of 10 days from the start of your illness until you are symptom-free including at least 24 hours of no symptoms including no fever, no shortness of breath, and no body aches and chills, by day 10 before returning to work or general contact with the public.  What does self quarantine  mean: avoiding contact with people as much as possible.   Particularly in your house, isolate your self from others in a separate room, wear a mask when possible in the room, particularly if coughing a lot.   Have others bring food, water, medications, etc., to your door, but avoid direct contact with your household contacts during this time to avoid spreading the infection to them.   If you have a separate bathroom and living quarters during the next 2 weeks away from others, that would be preferable.    If you can't completely isolate, then wear a mask, wash hands frequently with soap and water for at least 15 seconds, minimize close contact with others, and have a friend or family member check regularly from a distance to make sure you are not getting seriously worse.     You should not be going out in public, should not be going to stores, to work or other public places until all your symptoms have resolved and at least 10 days + 24 hours of no symptoms at all have transpired.   Ideally you should avoid contact with others for a full 10 days if possible.  One of the goals is to limit spread to high risk people; people that are older and elderly, people with multiple health issues like diabetes, heart disease, lung disease, and anybody that has weakened immune systems such as people with cancer or on immunosuppressive therapy.      Stacey Hart was seen today for sick.  Diagnoses and all orders for this visit:  Cough -     POC COVID-19 BinaxNow; Future -     Novel Coronavirus, NAA ([facility]); Future -     Influenza A/B; Future  Loss of smell -     POC COVID-19 BinaxNow; Future -     Novel Coronavirus, NAA ([facility]); Future -     Influenza A/B; Future  Fever, unspecified fever cause -     POC COVID-19 BinaxNow; Future -     Novel Coronavirus, NAA ([facility]); Future -     Influenza A/B; Future  Other orders -     Multiple Vitamins-Minerals (EMERGEN-C IMMUNE PLUS) PACK; Take 1 tablet by mouth  2 (two) times daily. -     albuterol (VENTOLIN HFA) 108 (90 Base) MCG/ACT inhaler; Inhale 2 puffs into the lungs every 6 (six) hours as needed for shortness of breath (Cough). -     guaiFENesin-dextromethorphan (ROBITUSSIN DM) 100-10 MG/5ML syrup; Take 5 mLs by mouth every 4 (four) hours as needed for cough.  Follow-up today in our back parking lot for Covid and flu testing

## 2020-02-04 NOTE — Addendum Note (Signed)
Addended by: Carlena Hurl on: 02/04/2020 02:14 PM   Modules accepted: Orders

## 2020-02-04 NOTE — Telephone Encounter (Signed)
Pt called and states that [facility] of [facility] [address], [facility] needs [facility] on [address]

## 2020-02-04 NOTE — Patient Instructions (Signed)
General recommendations: I recommend you rest, hydrate well with water and clear fluids throughout the day.   You can use Tylenol for pain or fever instead of the [facility] powders You can use over the counter Robitussin DM for cough/congestion. You can use over the counter Emetrol for nausea.    Begin [employer] immune plus vitamin pack over the counter Use your inhaler as needed for wheezing or shortness of breath  If you are having trouble breathing, if you are very weak, have high fever 103 or higher consistently despite Tylenol, or uncontrollable nausea and vomiting, then call or go to the emergency department.    If you have other questions or have other symptoms or questions you are concerned about then please call back  Covid symptoms such as fatigue and cough can linger over 2 weeks, even after the initial fever, aches, chills, and other initial symptoms.   Self Quarantine: The CDC, Centers for Disease Control has recommended a self quarantine of 10 days from the start of your illness until you are symptom-free including at least 24 hours of no symptoms including no fever, no shortness of breath, and no body aches and chills, by day 10 before returning to work or general contact with the public.  What does self quarantine mean: avoiding contact with people as much as possible.   Particularly in your house, isolate your self from others in a separate room, wear a mask when possible in the room, particularly if coughing a lot.   Have others bring food, water, medications, etc., to your door, but avoid direct contact with your household contacts during this time to avoid spreading the infection to them.   If you have a separate bathroom and living quarters during the next 2 weeks away from others, that would be preferable.    If you can't completely isolate, then wear a mask, wash hands frequently with soap and water for at least 15 seconds, minimize close contact with others, and have a friend or  family member check regularly from a distance to make sure you are not getting seriously worse.     You should not be going out in public, should not be going to stores, to work or other public places until all your symptoms have resolved and at least 10 days + 24 hours of no symptoms at all have transpired.   Ideally you should avoid contact with others for a full 10 days if possible.  One of the goals is to limit spread to high risk people; people that are older and elderly, people with multiple health issues like diabetes, heart disease, lung disease, and anybody that has weakened immune systems such as people with cancer or on immunosuppressive therapy.

## 2020-02-06 LAB — NOVEL CORONAVIRUS, NAA: SARS-CoV-2, NAA: DETECTED — AB

## 2020-02-06 LAB — SARS-COV-2, NAA 2 DAY TAT

## 2020-02-10 ENCOUNTER — Telehealth: Payer: Self-pay | Admitting: Neurology

## 2020-02-10 ENCOUNTER — Other Ambulatory Visit: Payer: Self-pay | Admitting: Medical

## 2020-02-10 NOTE — Telephone Encounter (Signed)
Pt. states her insurance no longer covers medication at pharmacy & asks that it be sent to [facility] at [address] in [place].

## 2020-02-10 NOTE — Telephone Encounter (Signed)
Is this okay to refill? 

## 2020-02-11 ENCOUNTER — Other Ambulatory Visit: Payer: Self-pay | Admitting: Emergency Medicine

## 2020-02-11 MED ORDER — CLONAZEPAM 0.5 MG PO TABS
0.5000 mg | ORAL_TABLET | Freq: Three times a day (TID) | ORAL | 3 refills | Status: DC | PRN
Start: 2020-02-11 — End: 2022-04-05

## 2020-02-11 NOTE — Addendum Note (Signed)
Addended by: Kathrynn Ducking on: 02/11/2020 02:58 PM   Modules accepted: Orders

## 2020-02-11 NOTE — Telephone Encounter (Signed)
Patient states she is fully out of Klonopin and needs the refill to be sent to [facility] on [address] in [place].

## 2020-02-11 NOTE — Telephone Encounter (Signed)
I will send in a prescription for clonazepam.

## 2020-02-13 ENCOUNTER — Other Ambulatory Visit: Payer: Self-pay

## 2020-02-13 MED ORDER — FAMOTIDINE 40 MG PO TABS: 40.0000 mg | ORAL_TABLET | Freq: Every day | ORAL | 1 refills | Status: DC

## 2020-02-13 MED ORDER — DEXLANSOPRAZOLE 60 MG PO CPDR: 60.0000 mg | DELAYED_RELEASE_CAPSULE | Freq: Every day | ORAL | 1 refills | Status: DC

## 2020-02-14 ENCOUNTER — Other Ambulatory Visit: Payer: Self-pay | Admitting: Medical

## 2020-02-14 ENCOUNTER — Telehealth: Payer: Self-pay | Admitting: Medical

## 2020-02-14 MED ORDER — FAMOTIDINE 40 MG PO TABS: 40.0000 mg | ORAL_TABLET | Freq: Every day | ORAL | 1 refills | Status: DC

## 2020-02-14 MED ORDER — BUPROPION HCL ER (XL) 150 MG PO TB24: 150.0000 mg | ORAL_TABLET | Freq: Every day | ORAL | 2 refills | Status: DC

## 2020-02-14 MED ORDER — DEXLANSOPRAZOLE 60 MG PO CPDR: 60.0000 mg | DELAYED_RELEASE_CAPSULE | Freq: Every day | ORAL | 1 refills | Status: DC

## 2020-02-14 NOTE — Telephone Encounter (Signed)
We will look out for prior authorization for Dexilant  She notes prior use of omeprazole, Nexium, Prilosec, Protonix and Dexilant has worked the best by far

## 2020-02-15 NOTE — Telephone Encounter (Signed)
P.A. DEXILANT completed

## 2020-02-18 ENCOUNTER — Other Ambulatory Visit: Payer: Self-pay | Admitting: Medical

## 2020-02-18 ENCOUNTER — Telehealth: Payer: Self-pay

## 2020-02-18 NOTE — Telephone Encounter (Signed)
P.A. [place] approved, pt informed

## 2020-02-19 ENCOUNTER — Telehealth: Payer: Self-pay

## 2020-02-19 NOTE — Telephone Encounter (Signed)
Pt Assistance Dexilant application mailed to patient

## 2020-02-19 NOTE — Telephone Encounter (Signed)
P.A. has been done and approved, pt informed

## 2020-02-25 ENCOUNTER — Other Ambulatory Visit: Payer: Self-pay

## 2020-02-25 ENCOUNTER — Ambulatory Visit (INDEPENDENT_AMBULATORY_CARE_PROVIDER_SITE_OTHER): Payer: BC Managed Care – PPO

## 2020-02-25 DIAGNOSIS — Z23 Encounter for immunization: Secondary | ICD-10-CM | POA: Diagnosis not present

## 2020-02-29 NOTE — Telephone Encounter (Signed)
P.A. approved and pt was informed

## 2020-03-12 ENCOUNTER — Ambulatory Visit (INDEPENDENT_AMBULATORY_CARE_PROVIDER_SITE_OTHER): Payer: BC Managed Care – PPO

## 2020-03-12 ENCOUNTER — Other Ambulatory Visit: Payer: Self-pay

## 2020-03-12 DIAGNOSIS — Z23 Encounter for immunization: Secondary | ICD-10-CM

## 2020-04-07 ENCOUNTER — Telehealth: Payer: Self-pay | Admitting: Medical

## 2020-04-07 NOTE — Telephone Encounter (Signed)
Schedule f/u on medication, see her email message

## 2020-04-08 NOTE — Telephone Encounter (Signed)
Patient informed to schedule appointment to discuss meds.

## 2020-04-20 ENCOUNTER — Ambulatory Visit: Payer: Managed Care, Other (non HMO) | Admitting: Neurology

## 2020-04-27 ENCOUNTER — Ambulatory Visit (INDEPENDENT_AMBULATORY_CARE_PROVIDER_SITE_OTHER): Payer: BC Managed Care – PPO | Admitting: Medical

## 2020-04-27 ENCOUNTER — Other Ambulatory Visit: Payer: Self-pay

## 2020-04-27 ENCOUNTER — Encounter: Payer: Self-pay | Admitting: Medical

## 2020-04-27 VITALS — BP 130/82 | HR 88 | Ht 69.0 in | Wt 198.8 lb

## 2020-04-27 DIAGNOSIS — Z13220 Encounter for screening for lipoid disorders: Secondary | ICD-10-CM

## 2020-04-27 DIAGNOSIS — K219 Gastro-esophageal reflux disease without esophagitis: Secondary | ICD-10-CM | POA: Diagnosis not present

## 2020-04-27 DIAGNOSIS — R748 Abnormal levels of other serum enzymes: Secondary | ICD-10-CM

## 2020-04-27 DIAGNOSIS — Z0000 Encounter for general adult medical examination without abnormal findings: Secondary | ICD-10-CM

## 2020-04-27 DIAGNOSIS — N946 Dysmenorrhea, unspecified: Secondary | ICD-10-CM

## 2020-04-27 DIAGNOSIS — Z1231 Encounter for screening mammogram for malignant neoplasm of breast: Secondary | ICD-10-CM

## 2020-04-27 DIAGNOSIS — Z23 Encounter for immunization: Secondary | ICD-10-CM | POA: Diagnosis not present

## 2020-04-27 DIAGNOSIS — F411 Generalized anxiety disorder: Secondary | ICD-10-CM

## 2020-04-27 DIAGNOSIS — Z131 Encounter for screening for diabetes mellitus: Secondary | ICD-10-CM

## 2020-04-27 DIAGNOSIS — G4459 Other complicated headache syndrome: Secondary | ICD-10-CM

## 2020-04-27 DIAGNOSIS — R1312 Dysphagia, oropharyngeal phase: Secondary | ICD-10-CM

## 2020-04-27 DIAGNOSIS — Z1211 Encounter for screening for malignant neoplasm of colon: Secondary | ICD-10-CM

## 2020-04-27 DIAGNOSIS — F419 Anxiety disorder, unspecified: Secondary | ICD-10-CM

## 2020-04-27 DIAGNOSIS — R070 Pain in throat: Secondary | ICD-10-CM

## 2020-04-27 DIAGNOSIS — G500 Trigeminal neuralgia: Secondary | ICD-10-CM

## 2020-04-27 DIAGNOSIS — Z124 Encounter for screening for malignant neoplasm of cervix: Secondary | ICD-10-CM

## 2020-04-27 DIAGNOSIS — I341 Nonrheumatic mitral (valve) prolapse: Secondary | ICD-10-CM | POA: Diagnosis not present

## 2020-04-27 DIAGNOSIS — Z803 Family history of malignant neoplasm of breast: Secondary | ICD-10-CM

## 2020-04-27 DIAGNOSIS — I493 Ventricular premature depolarization: Secondary | ICD-10-CM

## 2020-04-27 MED ORDER — BUPROPION HCL ER (XL) 300 MG PO TB24: 300.0000 mg | ORAL_TABLET | Freq: Every day | ORAL | 1 refills | Status: DC

## 2020-04-27 NOTE — Addendum Note (Signed)
Addended by: Carlena Hurl on: 04/27/2020 01:22 PM   Modules accepted: Orders

## 2020-04-27 NOTE — Progress Notes (Signed)
Subjective:   HPI  Stacey Hart is a 50 y.o. female who presents for Chief Complaint  Patient presents with  . Annual Exam    Cpe with fasting labs     Medical team:  [facility] clinic - neurology  Dr. Jannifer Franklin, [facility] Neurology prior  Dentist  Eye doctor  Therapist, began care recently  Pao Haffey, Camelia Eng, PA-C here for primary care   Concerns: Seeing neurology at [facility] in [place].  Currently they have her on clonazpeam TID, on Topamax, weaning off clonazepam soon.   Diagnosed with Trigeminal neuralgia, brain stem aura headaches.    She requests covid booster  laryngospasms - Uses Dexilant prn, but is taking Famotine daily  Asthma - no recent problems.   Last used albuterol inhaler months ago.  Doing fine currently.  Allergic rhinitis - doing fine on current regimen  Periods are somewhat irregular  Past Medical History:  Diagnosis Date  . Allergy   . Asthma   . Eczema   . GERD (gastroesophageal reflux disease)   . Migraines    in past, but not currently 12/2017    Family History  Problem Relation Age of Onset  . Cancer Mother 95       breast  . HIV/AIDS Father   . Cancer Maternal Grandmother        bone cancer  . Stroke Maternal Grandmother   . Cancer Maternal Grandfather        prostate cancer  . ADD / ADHD Son   . ADD / ADHD Daughter   . Diabetes Neg Hx   . Heart disease Neg Hx      Current Outpatient Medications:  .  azelastine (ASTELIN) 0.1 % nasal spray, Place 2 sprays into both nostrils 2 (two) times daily. Use in each nostril as directed, Disp: 30 mL, Rfl: 5 .  buPROPion (WELLBUTRIN XL) 300 MG 24 hr tablet, Take 1 tablet (300 mg total) by mouth daily., Disp: 30 tablet, Rfl: 1 .  clonazePAM (KLONOPIN) 0.5 MG tablet, Take 1 tablet (0.5 mg total) by mouth 3 (three) times daily as needed for anxiety., Disp: 90 tablet, Rfl: 3 .  desonide (DESOWEN) 0.05 % ointment, , Disp: , Rfl:  .  famotidine (PEPCID) 40 MG tablet, Take 1  tablet (40 mg total) by mouth daily., Disp: 30 tablet, Rfl: 1 .  fluticasone (FLONASE) 50 MCG/ACT nasal spray, Place 2 sprays into both nostrils daily., Disp: 16 g, Rfl: 5 .  ketoconazole (NIZORAL) 2 % cream, , Disp: , Rfl:  .  rizatriptan (MAXALT-MLT) 10 MG disintegrating tablet, Take by mouth., Disp: , Rfl:  .  topiramate (TOPAMAX) 50 MG tablet, 50 mg tab, 1/2 tab at night for 2 weeks then 1 tab at night, Disp: , Rfl:  .  albuterol (VENTOLIN HFA) 108 (90 Base) MCG/ACT inhaler, Inhale 2 puffs into the lungs every 6 (six) hours as needed for shortness of breath (Cough). (Patient not taking: Reported on 04/27/2020), Disp: 18 g, Rfl: 0 .  dexlansoprazole (DEXILANT) 60 MG capsule, Take 1 capsule (60 mg total) by mouth daily. (Patient not taking: Reported on 04/27/2020), Disp: 30 capsule, Rfl: 1  Allergies  Allergen Reactions  . Latex Other (See Comments)    VAGINAL AREA - BURNING AND ITCHING      Reviewed their medical, surgical, family, social, medication, and allergy history and updated chart as appropriate.   Review of Systems Constitutional: -fever, -chills, -sweats, -unexpected weight change, -decreased appetite, -fatigue Allergy: -sneezing, -itching, -congestion Dermatology: -  changing moles, --rash, -lumps ENT: -runny nose, -ear pain, -sore throat, -hoarseness, -sinus pain, -teeth pain, - ringing in ears, -hearing loss, -nosebleeds Cardiology: -chest pain, -palpitations, -swelling, -difficulty breathing when lying flat, -waking up short of breath Respiratory: -cough, -shortness of breath, -difficulty breathing with exercise or exertion, -wheezing, -coughing up blood Gastroenterology: -abdominal pain, -nausea, -vomiting, -diarrhea, -constipation, -blood in stool, -changes in bowel movement, -difficulty swallowing or eating Hematology: -bleeding, -bruising  Musculoskeletal: -joint aches, -muscle aches, -joint swelling, -back pain, -neck pain, -cramping, -changes in gait Ophthalmology:  denies vision changes, eye redness, itching, discharge Urology: -burning with urination, -difficulty urinating, -blood in urine, -urinary frequency, -urgency, -incontinence Neurology: -headache, -weakness, -tingling, -numbness, -memory loss, -falls, -dizziness Psychology: -depressed mood, -agitation, -sleep problems Breast/gyn: -breast tendnerss, -discharge, -lumps, -vaginal discharge,- irregular periods, -heavy periods     Objective:  BP 130/82   Pulse 88   Ht 5' 9"  (1.753 m)   Wt 198 lb 12.8 oz (90.2 kg)   SpO2 98%   BMI 29.36 kg/m   General appearance: alert, no distress, WD/WN, African American female Skin: unremarkable HEENT: normocephalic, conjunctiva/corneas normal, sclerae anicteric, PERRLA, EOMi, nares patent, no discharge or erythema, pharynx normal Oral cavity: MMM, tongue normal, teeth normal Neck: supple, no lymphadenopathy, no thyromegaly, no masses, normal ROM, no bruits Chest: non tender, normal shape and expansion Heart: RRR, normal S1, S2, no murmurs Lungs: CTA bilaterally, no wheezes, rhonchi, or rales Abdomen: +bs, soft, non tender, non distended, no masses, no hepatomegaly, no splenomegaly, no bruits Back: non tender, normal ROM, no scoliosis Musculoskeletal: upper extremities non tender, no obvious deformity, normal ROM throughout, lower extremities non tender, no obvious deformity, normal ROM throughout Extremities: no edema, no cyanosis, no clubbing Pulses: 2+ symmetric, upper and lower extremities, normal cap refill Neurological: alert, oriented x 3, CN2-12 intact, strength normal upper extremities and lower extremities, sensation normal throughout, DTRs 2+ throughout, no cerebellar signs, gait normal Psychiatric: normal affect, behavior normal, pleasant  Breast/gyn/rectal - deferred     Assessment and Plan :   Encounter Diagnoses  Name Primary?  . Encounter for health maintenance examination in adult Yes  . Mitral valve prolapse   . Laryngopharyngeal  reflux   . Anxiety   . Burning sensation of throat   . Oropharyngeal dysphagia   . Family history of breast cancer   . Screening for lipid disorders   . Screening for cervical cancer   . PVC (premature ventricular contraction)   . Low serum HDL   . Encounter for screening mammogram for malignant neoplasm of breast   . Screen for colon cancer   . Screening for diabetes mellitus   . Dysmenorrhea   . Trigeminal neuralgia   . Complicated headache syndromes   . Need for COVID-19 vaccine   . Generalized anxiety disorder     Today you had a preventative care visit or wellness visit.    Topics today may have included healthy lifestyle, diet, exercise, preventative care, vaccinations, sick and well care, proper use of emergency dept and after hours care, as well as other concerns.     Recommendations: Continue to return yearly for your annual wellness and preventative care visits.  This gives [place] a chance to discuss healthy lifestyle, exercise, vaccinations, review your chart record, and perform screenings where appropriate.  I recommend you see your eye doctor yearly for routine vision care.  I recommend you see your dentist yearly for routine dental care including hygiene visits twice yearly.   Vaccination recommendations  were reviewed You are up to date on tetanus vaccine You are up to date on covid vaccine I recommend a yearly flu shot  Counseled on the  Covid virus vaccine.  Vaccine information sheet given.  Covid vaccine given after consent obtained.   Screening for cancer: Breast cancer screening: You should perform a self breast exam monthly.   We reviewed recommendations for regular mammograms and breast cancer screening.  Please call to schedule your mammogram   [facility]  [phone], [place], [place] [address]   Colon cancer screening:  Call insurance to check coverage for Cologard vs  colonoscopy  Cervical cancer screening: We reviewed recommendations for pap smear screening.  Return soon for breast and pelvic exam. You are due for pap with HPV test.  Skin cancer screening: Check your skin regularly for new changes, growing lesions, or other lesions of concern Come in for evaluation if you have skin lesions of concern.  Lung cancer screening: If you have a greater than 30 pack year history of tobacco use, then you qualify for lung cancer screening with a chest CT scan  We currently don't have screenings for other cancers besides breast, cervical, colon, and lung cancers.  If you have a strong family history of cancer or have other cancer screening concerns, please let me know.    Bone health: Get at least 150 minutes of aerobic exercise weekly Get weight bearing exercise at least once weekly   Heart health: Get at least 150 minutes of aerobic exercise weekly Limit alcohol It is important to maintain a healthy blood pressure and healthy cholesterol numbers   Separate significant issues discussed: Asthma-no recent concerns, continue albuterol as needed  Allergic rhinitis-doing fine on current regimen  Laryngeal spasm, continue famotidine daily, Dexilant as needed  Complicated headaches, brainstem migraine with aura, burning sensation in throat, seeing neurology-follow-up with neurology, they currently manage her Topamax and clonazepam  Generalized anxiety-increase Wellbutrin to 300 mg XL daily.  Doing okay on the lower dose but would likely benefit from a higher dose  History of PVC, mitral valve prolapse-no current issues  She will return at her convenience for breast and pelvic exam.  She wanted to defer this today  Vanity was seen today for annual exam.  Diagnoses and all orders for this visit:  Encounter for health maintenance examination in adult -     Comprehensive metabolic panel -     CBC with Differential/Platelet -     Lipid panel -      Hemoglobin A1c -     VITAMIN D 25 Hydroxy (Vit-D Deficiency, Fractures)  Mitral valve prolapse  Laryngopharyngeal reflux  Anxiety  Burning sensation of throat  Oropharyngeal dysphagia  Family history of breast cancer  Screening for lipid disorders  Screening for cervical cancer -     Lipid panel  PVC (premature ventricular contraction)  Low serum HDL -     Lipid panel  Encounter for screening mammogram for malignant neoplasm of breast  Screen for colon cancer  Screening for diabetes mellitus -     Hemoglobin A1c  Dysmenorrhea  Trigeminal neuralgia  Complicated headache syndromes  Need for COVID-19 vaccine  Generalized anxiety disorder  Other orders -     buPROPion (WELLBUTRIN XL) 300 MG 24 hr tablet; Take 1 tablet (300 mg total) by mouth daily.   Follow-up pending labs, yearly for physical

## 2020-04-28 ENCOUNTER — Other Ambulatory Visit: Payer: Self-pay | Admitting: Medical

## 2020-04-28 LAB — CBC WITH DIFFERENTIAL/PLATELET
Basophils Absolute: 0 10*3/uL (ref 0.0–0.2)
Basos: 1 %
EOS (ABSOLUTE): 0 10*3/uL (ref 0.0–0.4)
Eos: 1 %
Hematocrit: 38.3 % (ref 34.0–46.6)
Hemoglobin: 12.7 g/dL (ref 11.1–15.9)
Immature Grans (Abs): 0 10*3/uL (ref 0.0–0.1)
Immature Granulocytes: 0 %
Lymphocytes Absolute: 1.7 10*3/uL (ref 0.7–3.1)
Lymphs: 31 %
MCH: 29.4 pg (ref 26.6–33.0)
MCHC: 33.2 g/dL (ref 31.5–35.7)
MCV: 89 fL (ref 79–97)
Monocytes Absolute: 0.6 10*3/uL (ref 0.1–0.9)
Monocytes: 11 %
Neutrophils Absolute: 3 10*3/uL (ref 1.4–7.0)
Neutrophils: 56 %
Platelets: 226 10*3/uL (ref 150–450)
RBC: 4.32 x10E6/uL (ref 3.77–5.28)
RDW: 13.1 % (ref 11.7–15.4)
WBC: 5.3 10*3/uL (ref 3.4–10.8)

## 2020-04-28 LAB — LIPID PANEL
Chol/HDL Ratio: 3.1 ratio (ref 0.0–4.4)
Cholesterol, Total: 148 mg/dL (ref 100–199)
HDL: 47 mg/dL (ref >39)
LDL Chol Calc (NIH): 77 mg/dL (ref 0–99)
Triglycerides: 136 mg/dL (ref 0–149)
VLDL Cholesterol Cal: 24 mg/dL (ref 5–40)

## 2020-04-28 LAB — COMPREHENSIVE METABOLIC PANEL
ALT: 14 IU/L (ref 0–32)
AST: 14 IU/L (ref 0–40)
Albumin/Globulin Ratio: 1.9 (ref 1.2–2.2)
Albumin: 4.3 g/dL (ref 3.8–4.8)
Alkaline Phosphatase: 68 IU/L (ref 44–121)
BUN/Creatinine Ratio: 15 (ref 9–23)
BUN: 11 mg/dL (ref 6–24)
Bilirubin Total: 0.2 mg/dL (ref 0.0–1.2)
CO2: 21 mmol/L (ref 20–29)
Calcium: 9.4 mg/dL (ref 8.7–10.2)
Chloride: 103 mmol/L (ref 96–106)
Creatinine, Ser: 0.74 mg/dL (ref 0.57–1.00)
Globulin, Total: 2.3 g/dL (ref 1.5–4.5)
Glucose: 113 mg/dL — ABNORMAL HIGH (ref 65–99)
Potassium: 4.3 mmol/L (ref 3.5–5.2)
Sodium: 140 mmol/L (ref 134–144)
Total Protein: 6.6 g/dL (ref 6.0–8.5)
eGFR: 100 mL/min/{1.73_m2} (ref >59)

## 2020-04-28 LAB — HEMOGLOBIN A1C
Est. average glucose Bld gHb Est-mCnc: 114 mg/dL
Hgb A1c MFr Bld: 5.6 % (ref 4.8–5.6)

## 2020-04-28 LAB — VITAMIN D 25 HYDROXY (VIT D DEFICIENCY, FRACTURES): Vit D, 25-Hydroxy: 12 ng/mL — ABNORMAL LOW (ref 30.0–100.0)

## 2020-04-28 MED ORDER — FAMOTIDINE 40 MG PO TABS: 40.0000 mg | ORAL_TABLET | Freq: Every day | ORAL | 3 refills | Status: AC

## 2020-04-28 MED ORDER — VITAMIN D 25 MCG (1000 UNIT) PO TABS: 2000.0000 [IU] | ORAL_TABLET | Freq: Every day | ORAL | 3 refills | Status: AC

## 2020-05-20 ENCOUNTER — Ambulatory Visit (INDEPENDENT_AMBULATORY_CARE_PROVIDER_SITE_OTHER): Payer: BC Managed Care – PPO | Admitting: Medical

## 2020-05-20 ENCOUNTER — Other Ambulatory Visit (HOSPITAL_COMMUNITY)
Admission: RE | Admit: 2020-05-20 | Discharge: 2020-05-20 | Disposition: A | Discharge status: Home / Self Care | Payer: Self-pay | Source: Ambulatory Visit | Attending: Medical | Admitting: Medical

## 2020-05-20 ENCOUNTER — Encounter: Payer: Self-pay | Admitting: Medical

## 2020-05-20 ENCOUNTER — Other Ambulatory Visit: Payer: Self-pay

## 2020-05-20 VITALS — BP 122/82 | HR 107 | Ht 69.0 in | Wt 199.2 lb

## 2020-05-20 DIAGNOSIS — J301 Allergic rhinitis due to pollen: Secondary | ICD-10-CM

## 2020-05-20 DIAGNOSIS — R430 Anosmia: Secondary | ICD-10-CM | POA: Diagnosis not present

## 2020-05-20 DIAGNOSIS — Z1231 Encounter for screening mammogram for malignant neoplasm of breast: Secondary | ICD-10-CM

## 2020-05-20 DIAGNOSIS — Z8616 Personal history of COVID-19: Secondary | ICD-10-CM | POA: Diagnosis not present

## 2020-05-20 DIAGNOSIS — Z124 Encounter for screening for malignant neoplasm of cervix: Secondary | ICD-10-CM | POA: Diagnosis not present

## 2020-05-20 NOTE — Progress Notes (Signed)
Subjective:  Stacey Hart is a 50 y.o. female who presents for Chief Complaint  Patient presents with  . Gynecologic Exam    Pt present for pap and breast exam.      Here today for follow-up for recent physical for breast and pelvic exam.  No breast concerns.  Checks regularly  She has history of 5 pregnancies, 4 live births, 1 abortion, no recent problems.  No vaginal discharge no pelvic pain.  No history of ovarian cyst or fibroids.  Still getting regular menstrual periods.  A recent period was heavy but usually she will have a week of spotting followed by a regular menstrual period.  Concerned about the sense of smell and taste.  Since she had COVID several months ago she has continued to not have sense of smell or taste.  She uses allergy nasal sprays and can even feel the spray going in.  She has tried some remedies to help with the sense of smell and taste but no luck.  No other aggravating or relieving factors.    No other c/o.  The following portions of the patient's history were reviewed and updated as appropriate: allergies, current medications, past family history, past medical history, past social history, past surgical history and problem list.  ROS Otherwise as in subjective above  Objective: BP 122/82   Pulse (!) 107   Ht 5' 9"  (1.753 m)   Wt 199 lb 3.2 oz (90.4 kg)   SpO2 97%   BMI 29.42 kg/m   General appearance: alert, no distress, well developed, well nourished  Breast: nontender, no masses or lumps, no skin changes, no nipple discharge or inversion, no axillary lymphadenopathy Gyn: Normal external genitalia without lesions, vagina with normal mucosa, cervix without lesions, no cervical motion tenderness, no abnormal vaginal discharge.  Uterus and adnexa not enlarged, nontender, no masses.  Pap performed.  Exam chaperoned by nurse. Rectal: Deferred    Assessment: Encounter Diagnoses  Name Primary?  . Encounter for screening mammogram for malignant  neoplasm of breast Yes  . Screening for cervical cancer   . Loss of smell   . History of COVID-19   . Allergic rhinitis due to pollen, unspecified seasonality      Plan: Advise she go ahead and schedule her mammogram Pap sent  Referral to ENT due to chronic loss of smell and taste, history of Covid, currently uses allergy nasal sprays.  I gave her a essential oral treatment regimen that has had some success.  She can try this  Delayla was seen today for gynecologic exam.  Diagnoses and all orders for this visit:  Encounter for screening mammogram for malignant neoplasm of breast  Screening for cervical cancer -     Cytology - PAP([facility])  Loss of smell -     Ambulatory referral to ENT  History of COVID-19 -     Ambulatory referral to ENT  Allergic rhinitis due to pollen, unspecified seasonality -     Ambulatory referral to ENT    Follow up: pending pap, referral

## 2020-05-20 NOTE — Patient Instructions (Signed)
Using essential oils for loss of smell and/or taste.   There are numerous articles in the news about re-training you nose with essential oils.   This is suggested approach from Dr. Francee Hart out of [place], [place].    It is not entirely clear why COVID-19 causes loss of sense of smell, but its likely because viruses, like the coronavirus, can damage nerves and COVID-19 enters and lives inside the nose.   One study cited over 86% of patients with mild to moderate covid symptoms lose smell or taste.  To help retrain the nose, here is a recommended approach.   . Purchase four essential oils; clove, eucalyptus, rose and lemon.  . Take the oils one at a time and put them on a cotton ball then breathe in the scent. . Focus for about 30 seconds and take deep breaths and just think about the scent . Repeat this three times a day for three to four weeks.

## 2020-05-20 NOTE — Addendum Note (Signed)
Addended by: Edgar Frisk on: 05/20/2020 11:28 AM   Modules accepted: Orders

## 2020-05-20 NOTE — Progress Notes (Signed)
Done

## 2020-05-21 LAB — CYTOLOGY - PAP
Adequacy: ABSENT
Comment: NEGATIVE
Diagnosis: NEGATIVE
High risk HPV: NEGATIVE

## 2020-05-25 ENCOUNTER — Other Ambulatory Visit: Payer: Self-pay

## 2020-05-25 DIAGNOSIS — Z1211 Encounter for screening for malignant neoplasm of colon: Secondary | ICD-10-CM

## 2020-06-15 ENCOUNTER — Other Ambulatory Visit: Payer: Self-pay | Admitting: Medical

## 2020-06-15 DIAGNOSIS — Z1231 Encounter for screening mammogram for malignant neoplasm of breast: Secondary | ICD-10-CM

## 2020-06-16 ENCOUNTER — Ambulatory Visit
Admission: RE | Admit: 2020-06-16 | Discharge: 2020-06-16 | Disposition: A | Discharge status: Home / Self Care | Payer: BC Managed Care – PPO | Source: Ambulatory Visit | Attending: Medical | Admitting: Medical

## 2020-06-16 ENCOUNTER — Other Ambulatory Visit: Payer: Self-pay

## 2020-06-16 DIAGNOSIS — Z1231 Encounter for screening mammogram for malignant neoplasm of breast: Secondary | ICD-10-CM

## 2020-06-16 LAB — COLOGUARD: Cologuard: NEGATIVE

## 2020-06-17 ENCOUNTER — Encounter (INDEPENDENT_AMBULATORY_CARE_PROVIDER_SITE_OTHER): Payer: Self-pay | Admitting: Otolaryngology

## 2020-06-17 ENCOUNTER — Ambulatory Visit (INDEPENDENT_AMBULATORY_CARE_PROVIDER_SITE_OTHER): Payer: BC Managed Care – PPO | Admitting: Otolaryngology

## 2020-06-17 VITALS — Temp 96.3°F

## 2020-06-17 DIAGNOSIS — J310 Chronic rhinitis: Secondary | ICD-10-CM | POA: Diagnosis not present

## 2020-06-17 DIAGNOSIS — R430 Anosmia: Secondary | ICD-10-CM | POA: Diagnosis not present

## 2020-06-17 NOTE — Progress Notes (Signed)
HPI: Stacey Hart is a 50 y.o. female who presents is referred by her PCP for evaluation of loss of smell and taste since she developed COVID in December.  She still has not recovered her sense of smell and taste. I had seen her in 2007 because of LPR. She was unvaccinated when she had the COVID.  She has been vaccinated since that time. She has history of allergies and uses Flonase. She has had no colored discharge from her nose.Marland Kitchen  Past Medical History:  Diagnosis Date  . Allergy   . Asthma   . Eczema   . GERD (gastroesophageal reflux disease)   . Migraines    in past, but not currently 12/2017   Past Surgical History:  Procedure Laterality Date  . FOOT SURGERY     left  . HERNIA REPAIR     umbilicus  . TUBAL LIGATION     Social History   Socioeconomic History  . Marital status: Married    Spouse name: Gwyndolyn Saxon  . Number of children: Not on file  . Years of education: Not on file  . Highest education level: Not on file  Occupational History  . Occupation: Optometrist  Tobacco Use  . Smoking status: Never Smoker  . Smokeless tobacco: Never Used  Vaping Use  . Vaping Use: Never used  Substance and Sexual Activity  . Alcohol use: Yes    Alcohol/week: 2.0 standard drinks    Types: 2 Shots of liquor per week  . Drug use: No  . Sexual activity: Yes    Birth control/protection: Surgical  Other Topics Concern  . Not on file  Social History Narrative   Lives with husband and 2 children, 1 dog   Right Handed   Drinks2-3 cups caffeine daily   Currently not working, homemaker   04/2020   Social Determinants of Health   Financial Resource Strain: Not on file  Food Insecurity: Not on file  Transportation Needs: Not on file  Physical Activity: Not on file  Stress: Not on file  Social Connections: Not on file   Family History  Problem Relation Age of Onset  . Cancer Mother 62       breast  . HIV/AIDS Father   . Cancer Maternal Grandmother        bone cancer   . Stroke Maternal Grandmother   . Cancer Maternal Grandfather        prostate cancer  . ADD / ADHD Son   . ADD / ADHD Daughter   . Diabetes Neg Hx   . Heart disease Neg Hx    Allergies  Allergen Reactions  . Latex Other (See Comments)    VAGINAL AREA - BURNING AND ITCHING   Prior to Admission medications   Medication Sig Start Date End Date Taking? Authorizing Provider  albuterol (VENTOLIN HFA) 108 (90 Base) MCG/ACT inhaler Inhale 2 puffs into the lungs every 6 (six) hours as needed for shortness of breath (Cough). Patient not taking: No sig reported 02/04/20   Tysinger, Camelia Eng, PA-C  azelastine (ASTELIN) 0.1 % nasal spray Place 2 sprays into both nostrils 2 (two) times daily. Use in each nostril as directed 08/07/19   Mannam, Praveen, MD  buPROPion (WELLBUTRIN XL) 300 MG 24 hr tablet Take 1 tablet (300 mg total) by mouth daily. 04/27/20   Tysinger, Camelia Eng, PA-C  cholecalciferol (VITAMIN D3) 25 MCG (1000 UNIT) tablet Take 2 tablets (2,000 Units total) by mouth daily. 04/28/20   Tysinger, Camelia Eng,  PA-C  clonazePAM (KLONOPIN) 0.5 MG tablet Take 1 tablet (0.5 mg total) by mouth 3 (three) times daily as needed for anxiety. 02/11/20   Kathrynn Ducking, MD  desonide (DESOWEN) 0.05 % ointment  03/08/19   [provider]  dexlansoprazole (DEXILANT) 60 MG capsule Take 1 capsule (60 mg total) by mouth daily. Patient not taking: No sig reported 02/14/20   Tysinger, Camelia Eng, PA-C  famotidine (PEPCID) 40 MG tablet Take 1 tablet (40 mg total) by mouth daily. 04/28/20   Tysinger, Camelia Eng, PA-C  fluticasone (FLONASE) 50 MCG/ACT nasal spray Place 2 sprays into both nostrils daily. 08/07/19   Mannam, Hart Robinsons, MD  ketoconazole (NIZORAL) 2 % cream  03/08/19   [provider]  rizatriptan (MAXALT-MLT) 10 MG disintegrating tablet Take by mouth. 03/09/20   [provider]  topiramate (TOPAMAX) 50 MG tablet 50 mg tab, 1/2 tab at night for 2 weeks then 1 tab at night 03/09/20   [provider]     Positive ROS: Otherwise negative  All other systems have been reviewed and were otherwise negative with the exception of those mentioned in the HPI and as above.  Physical Exam: Constitutional: Alert, well-appearing, no acute distress Ears: External ears without lesions or tenderness. Ear canals are clear bilaterally with intact, clear TMs.  Nasal: External nose without lesions. Septum is midline with mild rhinitis.  On nasal endoscopy both middle meatus regions are clear with no polyps no significant swelling and no purulent discharge.  The superior nasal vault was clear..  No signs of active infection..  Nasopharynx was clear. Oral: Lips and gums without lesions. Tongue and palate mucosa without lesions. Posterior oropharynx clear. Neck: No palpable adenopathy or masses Respiratory: Breathing comfortably  Skin: No facial/neck lesions or rash noted.  Procedures  Assessment: Anosmia following COVID in December. Chronic rhinitis.  Plan: Reviewed with her concerning long-term sequelae of people with COVID.  There is no specific treatment at this point for loss of sense of smell and taste. Hopefully this will gradually improve. Recommended regular use of her Flonase 2 sprays each nostril at night.   Radene Journey, MD   CC:

## 2020-06-24 LAB — COLOGUARD

## 2020-06-25 ENCOUNTER — Encounter: Payer: Self-pay | Admitting: Medical

## 2020-06-25 ENCOUNTER — Telehealth: Payer: Self-pay | Admitting: Medical

## 2020-06-25 NOTE — Telephone Encounter (Signed)
Result has been sent to patient via mychart

## 2020-06-25 NOTE — Telephone Encounter (Signed)
Please let them know that the Cologuard screening for colon cancer was negative.  This indicates a lower likelihood that colorectal cancer is present.   Lets plan to repeat this in 3 years.  However, if the develop bowel changes, blood in stool, unexpected weight loss, or other new bowel changes, then recheck.

## 2020-06-27 ENCOUNTER — Other Ambulatory Visit: Payer: Self-pay | Admitting: Medical

## 2020-07-28 ENCOUNTER — Other Ambulatory Visit: Payer: Self-pay | Admitting: Medical

## 2020-08-27 ENCOUNTER — Other Ambulatory Visit: Payer: Self-pay | Admitting: Medical

## 2020-09-14 ENCOUNTER — Other Ambulatory Visit: Payer: Self-pay | Admitting: Neurology

## 2020-10-09 ENCOUNTER — Telehealth: Payer: Self-pay | Admitting: Medical

## 2020-10-09 NOTE — Telephone Encounter (Signed)
Dismissal letter in guarantor snapshot

## 2020-10-29 ENCOUNTER — Other Ambulatory Visit: Payer: Self-pay | Admitting: Medical

## 2020-11-12 ENCOUNTER — Other Ambulatory Visit: Payer: Self-pay | Admitting: Medical

## 2021-01-06 ENCOUNTER — Other Ambulatory Visit: Payer: Self-pay | Admitting: Medical

## 2021-12-22 ENCOUNTER — Telehealth: Payer: Self-pay | Admitting: Neurology

## 2021-12-22 NOTE — Telephone Encounter (Signed)
Pt is calling. Stated she needs a sooner appointment because her jaw line is tingling and her left ey is jumping. Stated it started this past Saturday. Pt is requesting a call back from nurse.

## 2021-12-22 NOTE — Telephone Encounter (Signed)
Patient has not been seen since 12/20/2019. She was seen for a separate issue than described. She has been seeing Dr. Cristopher Peru as her Neurologist. She will need to follow up with Dr. Sherryll Burger, her PCP, or get a referral for the new issue.

## 2021-12-28 ENCOUNTER — Encounter: Payer: Self-pay | Admitting: Neurology

## 2021-12-28 ENCOUNTER — Ambulatory Visit (INDEPENDENT_AMBULATORY_CARE_PROVIDER_SITE_OTHER): Payer: BC Managed Care – PPO | Admitting: Neurology

## 2021-12-28 VITALS — BP 133/85 | HR 79 | Ht 69.0 in | Wt 212.0 lb

## 2021-12-28 DIAGNOSIS — G43709 Chronic migraine without aura, not intractable, without status migrainosus: Secondary | ICD-10-CM | POA: Diagnosis not present

## 2021-12-28 DIAGNOSIS — G4719 Other hypersomnia: Secondary | ICD-10-CM | POA: Diagnosis not present

## 2021-12-28 MED ORDER — TOPIRAMATE 100 MG PO TABS: 200.0000 mg | ORAL_TABLET | Freq: Every evening | ORAL | 11 refills | Status: DC

## 2021-12-28 MED ORDER — BUPROPION HCL ER (XL) 300 MG PO TB24: 300.0000 mg | ORAL_TABLET | Freq: Every day | ORAL | 11 refills | Status: DC

## 2021-12-28 MED ORDER — RIZATRIPTAN BENZOATE 10 MG PO TBDP: ORAL_TABLET | ORAL | 11 refills | Status: AC

## 2021-12-28 NOTE — Progress Notes (Signed)
Chief Complaint  Patient presents with   Follow-up    Rm 15. Alone. Jaw tingling, left eye jumping.      ASSESSMENT AND PLAN  Stacey Hart is a 51 y.o. female   Chronic cough Frequent migraine headaches Previous abnormal MRI of the brain  MRI of the brain without contrast in November 2021 showed chronic cerebral microhemorrhage in the right cerebellum vermis, left frontal, left parietal region, possible vascular malformation,  Repeat MRI of the brain with without contrast  Laboratory evaluations  Higher dose of Topamax 100 mg 2 tablets every night as migraine prevention,  Husband reported the patient suffer depression anxiety, Wellbutrin was helpful, restart Wellbutrin 300 mg every morning,   Maxalt as needed  High risk for obstructive sleep apnea  Narrow oropharyngeal space, frequent awakening, excessive daytime sleepiness, fatigue,  Refer to sleep study   Return To Clinic With NP In 3 Months  DIAGNOSTIC DATA (LABS, IMAGING, TESTING) - I reviewed patient records, labs, notes, testing and imaging myself where available.   MEDICAL HISTORY:  Stacey Hart is a 51 year old female, accompanied by her husband following up for migraine headaches, frequent coughing spells,   I reviewed and summarized the referring note. PMHX. Depression, anxiety,   Patient was seen by Dr. Jannifer Franklin once in November 2021 for similar complaints, complains of frequent coughing spells, can up to 20-30 episodes daily, I was able to watch a video clip, she was coughing to the point of excessive tear, nasal discharge, saliva,  She was also seen by ENT, diagnosed with acid reflux, laryngeal spasm, was treated with Pepcid, PPI for short well without significant improvement  Dr. Jannifer Franklin prescribed clonazepam 0.5 mg daily, patient reported helpful, but lost follow-up  She was seen by [facility] system neurologist Dr. Manuella Ghazi, most recent visit November 2022, he will continue on her clonazepam refill,  reviewed controlled substance registry, most recent refill was in March 2023, 0.5 mg 90 tablets  She was also given a diagnosis of anxiety, depression, basilar migraine  She had long history of frequent headaches, 2-3 times each week, smell bright light weather changes triggered, pounding headache, with confusion, word finding difficulties, sleepiness light sensitivity, movement make it worse, Maxalt as needed was helpful  In addition she complains of loud snoring, frequent awakening at nighttime, husband reported gasping for air episode, excessive daytime sleepiness, fatigue  Personally reviewed MRI of the brain without contrast November 2021 chronic cerebral microhemorrhage in the right cerebellum, left parietal and left frontal, suspicious for vascular malformation   PHYSICAL EXAM:   Vitals:   12/28/21 1525  BP: 133/85  Pulse: 79  Weight: 212 lb (96.2 kg)  Height: 5\' 9"  (1.753 m)   Not recorded     Body mass index is 31.31 kg/m.  PHYSICAL EXAMNIATION:  Gen: NAD, conversant, well nourised, well groomed                     Cardiovascular: Regular rate rhythm, no peripheral edema, warm, nontender. Eyes: Conjunctivae clear without exudates or hemorrhage Neck: Supple, no carotid bruits. Pulmonary: Clear to auscultation bilaterally   NEUROLOGICAL EXAM:  MENTAL STATUS: Speech/cognition: Awake, alert, oriented to history taking and casual conversation CRANIAL NERVES: CN II: Visual fields are full to confrontation. Pupils are round equal and briskly reactive to light. CN III, IV, VI: extraocular movement are normal. No ptosis. CN V: Facial sensation is intact to light touch CN VII: Face is symmetric with normal eye closure  CN VIII: Hearing is  normal to causal conversation. CN IX, X: Phonation is normal.  Normal gag reflex, drapery of soft palate CN XI: Head turning and shoulder shrug are intact CN XII: Narrow long pharyngeal space, tongue midline,  MOTOR: There is no  pronator drift of out-stretched arms. Muscle bulk and tone are normal. Muscle strength is normal.  REFLEXES: Reflexes are 2+ and symmetric at the biceps, triceps, knees, and ankles. Plantar responses are flexor.  SENSORY: Intact to light touch, pinprick and vibratory sensation are intact in fingers and toes.  COORDINATION: There is no trunk or limb dysmetria noted.  GAIT/STANCE: Posture is normal. Gait is steady with normal steps, base, arm swing, and turning. Heel and toe walking are normal. Tandem gait is normal.  Romberg is absent.  REVIEW OF SYSTEMS:  Full 14 system review of systems performed and notable only for as above All other review of systems were negative.   ALLERGIES: Allergies  Allergen Reactions   Latex Other (See Comments)    VAGINAL AREA - BURNING AND ITCHING    HOME MEDICATIONS: Current Outpatient Medications  Medication Sig Dispense Refill   cholecalciferol (VITAMIN D3) 25 MCG (1000 UNIT) tablet Take 2 tablets (2,000 Units total) by mouth daily. (Patient taking differently: Take 3,000 Units by mouth daily.) 180 tablet 3   desonide (DESOWEN) 0.05 % ointment      fluticasone (FLONASE) 50 MCG/ACT nasal spray Place 2 sprays into both nostrils daily. 16 g 5   ketoconazole (NIZORAL) 2 % cream Apply 1 Application topically as needed.     topiramate (TOPAMAX) 50 MG tablet 50 mg tab, 1/2 tab at night for 2 weeks then 1 tab at night     buPROPion (WELLBUTRIN XL) 300 MG 24 hr tablet TAKE ONE TABLET BY MOUTH DAILY (Patient not taking: Reported on 12/28/2021) 30 tablet 1   clonazePAM (KLONOPIN) 0.5 MG tablet Take 1 tablet (0.5 mg total) by mouth 3 (three) times daily as needed for anxiety. (Patient not taking: Reported on 12/28/2021) 90 tablet 3   famotidine (PEPCID) 40 MG tablet Take 1 tablet (40 mg total) by mouth daily. (Patient not taking: Reported on 12/28/2021) 90 tablet 3   rizatriptan (MAXALT-MLT) 10 MG disintegrating tablet Take by mouth. (Patient not taking:  Reported on 12/28/2021)     No current facility-administered medications for this visit.    PAST MEDICAL HISTORY: Past Medical History:  Diagnosis Date   Allergy    Asthma    Eczema    GERD (gastroesophageal reflux disease)    Migraines    in past, but not currently 12/2017    PAST SURGICAL HISTORY: Past Surgical History:  Procedure Laterality Date   FOOT SURGERY     left   HERNIA REPAIR     umbilicus   TUBAL LIGATION      FAMILY HISTORY: Family History  Problem Relation Age of Onset   Cancer Mother 67       breast   HIV/AIDS Father    Cancer Maternal Grandmother        bone cancer   Stroke Maternal Grandmother    Cancer Maternal Grandfather        prostate cancer   ADD / ADHD Son    ADD / ADHD Daughter    Diabetes Neg Hx    Heart disease Neg Hx     SOCIAL HISTORY: Social History   Socioeconomic History   Marital status: Married    Spouse name: Gwyndolyn Saxon   Number of children: Not on file  Years of education: Not on file   Highest education level: Not on file  Occupational History   Occupation: accountant  Tobacco Use   Smoking status: Never   Smokeless tobacco: Never  Vaping Use   Vaping Use: Never used  Substance and Sexual Activity   Alcohol use: Yes    Alcohol/week: 2.0 standard drinks of alcohol    Types: 2 Shots of liquor per week   Drug use: No   Sexual activity: Yes    Birth control/protection: Surgical  Other Topics Concern   Not on file  Social History Narrative   Lives with husband and 2 children, 1 dog   Right Handed   Drinks2-3 cups caffeine daily   Currently not working, homemaker   04/2020   Social Determinants of Health   Financial Resource Strain: Not on file  Food Insecurity: Not on file  Transportation Needs: Not on file  Physical Activity: Not on file  Stress: Not on file  Social Connections: Not on file  Intimate Partner Violence: Not on file      Levert Hart, M.D. Ph.D.  [facility] [address], [address] [place], [place] [address] Ph: [phone] Fax: [phone]  CC:  No referring provider defined for this encounter.  Pcp, No

## 2021-12-29 LAB — CBC WITH DIFFERENTIAL
Basophils Absolute: 0.1 10*3/uL (ref 0.0–0.2)
Basos: 1 %
EOS (ABSOLUTE): 0.1 10*3/uL (ref 0.0–0.4)
Eos: 1 %
Hematocrit: 40.5 % (ref 34.0–46.6)
Hemoglobin: 13.3 g/dL (ref 11.1–15.9)
Immature Grans (Abs): 0 10*3/uL (ref 0.0–0.1)
Immature Granulocytes: 0 %
Lymphocytes Absolute: 2.6 10*3/uL (ref 0.7–3.1)
Lymphs: 36 %
MCH: 28.9 pg (ref 26.6–33.0)
MCHC: 32.8 g/dL (ref 31.5–35.7)
MCV: 88 fL (ref 79–97)
Monocytes Absolute: 0.8 10*3/uL (ref 0.1–0.9)
Monocytes: 11 %
Neutrophils Absolute: 3.6 10*3/uL (ref 1.4–7.0)
Neutrophils: 51 %
RBC: 4.61 x10E6/uL (ref 3.77–5.28)
RDW: 12.8 % (ref 11.7–15.4)
WBC: 7.1 10*3/uL (ref 3.4–10.8)

## 2021-12-29 LAB — SEDIMENTATION RATE: Sed Rate: 11 mm/hr (ref 0–32)

## 2021-12-29 LAB — COMPREHENSIVE METABOLIC PANEL
ALT: 15 IU/L (ref 0–32)
AST: 18 IU/L (ref 0–40)
Albumin/Globulin Ratio: 1.6 (ref 1.2–2.2)
Albumin: 4.5 g/dL (ref 3.9–4.9)
Alkaline Phosphatase: 67 IU/L (ref 44–121)
BUN/Creatinine Ratio: 11 (ref 9–23)
BUN: 11 mg/dL (ref 6–24)
Bilirubin Total: <0.2 mg/dL (ref 0.0–1.2)
CO2: 19 mmol/L — ABNORMAL LOW (ref 20–29)
Calcium: 10.1 mg/dL (ref 8.7–10.2)
Chloride: 102 mmol/L (ref 96–106)
Creatinine, Ser: 1.01 mg/dL — ABNORMAL HIGH (ref 0.57–1.00)
Globulin, Total: 2.9 g/dL (ref 1.5–4.5)
Glucose: 102 mg/dL — ABNORMAL HIGH (ref 70–99)
Potassium: 4.5 mmol/L (ref 3.5–5.2)
Sodium: 138 mmol/L (ref 134–144)
Total Protein: 7.4 g/dL (ref 6.0–8.5)
eGFR: 68 mL/min/{1.73_m2} (ref >59)

## 2021-12-29 LAB — ANA W/REFLEX IF POSITIVE
Anti JO-1: <0.2 AI (ref 0.0–0.9)
Anti Nuclear Antibody (ANA): POSITIVE — AB
Centromere Ab Screen: <0.2 AI (ref 0.0–0.9)
Chromatin Ab SerPl-aCnc: <0.2 AI (ref 0.0–0.9)
ENA RNP Ab: 0.2 AI (ref 0.0–0.9)
ENA SM Ab Ser-aCnc: <0.2 AI (ref 0.0–0.9)
ENA SSA (RO) Ab: <0.2 AI (ref 0.0–0.9)
ENA SSB (LA) Ab: 2.6 AI — ABNORMAL HIGH (ref 0.0–0.9)
Scleroderma (Scl-70) (ENA) Antibody, IgG: <0.2 AI (ref 0.0–0.9)
dsDNA Ab: <1 IU/mL (ref 0–9)

## 2021-12-29 LAB — C-REACTIVE PROTEIN: CRP: 5 mg/L (ref 0–10)

## 2021-12-29 LAB — VITAMIN B12: Vitamin B-12: 344 pg/mL (ref 232–1245)

## 2021-12-29 LAB — TSH: TSH: 1.98 u[IU]/mL (ref 0.450–4.500)

## 2021-12-29 LAB — RPR: RPR Ser Ql: NONREACTIVE

## 2022-01-03 ENCOUNTER — Encounter: Payer: Self-pay | Admitting: Neurology

## 2022-01-05 ENCOUNTER — Telehealth: Payer: Self-pay | Admitting: Neurology

## 2022-01-05 NOTE — Telephone Encounter (Signed)
  Please call patient   Laboratory evaluation showed   --- Positive ANA with positive SSB antibody, which has unknown clinical significance.  Sometimes this can be associated with Sjogren's disease, patient can present with dry eye and dry mouth.  May consider repeat laboratory evaluation at next scheduled follow-up visit   --- Slight elevation of creatinine, with normal GFR, this is an indicator of kidney function, this can be helped by increase water intake    ----Rest of the laboratory evaluation showed no significant abnormalities.

## 2022-01-06 NOTE — Telephone Encounter (Addendum)
I called the pt and relayed the message. She verbalized understanding and appreciation for the call. She will keep f/u as scheduled.   She did inquire if any other specific testing could be done on the ANA antibody since the results were positive. She would like to be proactive on any additional testing we could possibly do on this?

## 2022-01-06 NOTE — Telephone Encounter (Signed)
We can repeat ANA test at next follow up visit.

## 2022-01-10 ENCOUNTER — Telehealth: Payer: Self-pay | Admitting: Neurology

## 2022-01-10 NOTE — Telephone Encounter (Signed)
MRI sent to [facility], they will reach out to patient to schedule.   Yetta Numbers: [id number] exp. 01/03/22-02/01/22.

## 2022-01-17 ENCOUNTER — Institutional Professional Consult (permissible substitution): Payer: BC Managed Care – PPO | Admitting: Neurology

## 2022-01-20 ENCOUNTER — Other Ambulatory Visit: Payer: Self-pay | Admitting: Obstetrics and Gynecology

## 2022-02-02 NOTE — Telephone Encounter (Signed)
BCBS Berkley Harvey: [id number] exp. 02/02/2022 - 03/03/2022  She is scheduled at [facility] on 02/16/22

## 2022-03-01 NOTE — Telephone Encounter (Signed)
Stacey Hart: [id number] exp. 03/01/22-03/30/22 scheduled at [facility] 1/29.

## 2022-04-04 NOTE — Progress Notes (Unsigned)
No chief complaint on file.     ASSESSMENT AND PLAN  Stacey Hart is a 52 y.o. female   Chronic cough Frequent migraine headaches Previous abnormal MRI of the brain  MRI of the brain without contrast in November 2021 showed chronic cerebral microhemorrhage in the right cerebellum vermis, left frontal, left parietal region, possible vascular malformation,  Repeat MRI of the brain with without contrast  Laboratory evaluations largely unremarkable except positive ANA - will repeat today  Higher dose of Topamax 100 mg 2 tablets every night as migraine prevention,  Husband reported the patient suffer depression anxiety, Wellbutrin was helpful, restart Wellbutrin 300 mg every morning,   Maxalt as needed   High risk for obstructive sleep apnea  Narrow oropharyngeal space, frequent awakening, excessive daytime sleepiness, fatigue,  Refer to sleep study    DIAGNOSTIC DATA (LABS, IMAGING, TESTING) - I reviewed patient records, labs, notes, testing and imaging myself where available.  Laboratory evaluation 12/2021: TSH 1.980, CRP 5, vitamin B12 344, RPR nonreactive, sed rate 11, ANA positive (ENA SSB 2.6), CBC normal, CMP creatinine 1.01 otherwise unremarkable     MEDICAL HISTORY:  Update 04/05/2022 JM: patient returns for 3 month follow up.  At prior visit, topiramate dosage increased to 200 mg nightly.    Extensive lab work at prior visit showed positive ANA of unknown clinical significance. Recommended repeating this at todays visit.  Recommended MRI brain at prior visit but not yet completed. Referral placed for sleep evaluation but not yet scheduled (office unable to contact to schedule)     Consult visit 12/28/2021 Dr. Krista Blue:  Stacey Hart is a 52 year old female, accompanied by her husband following up for migraine headaches, frequent coughing spells,   I reviewed and summarized the referring note. PMHX. Depression, anxiety,   Patient was seen by Dr. Jannifer Franklin  once in November 2021 for similar complaints, complains of frequent coughing spells, can up to 20-30 episodes daily, I was able to watch a video clip, she was coughing to the point of excessive tear, nasal discharge, saliva,  She was also seen by ENT, diagnosed with acid reflux, laryngeal spasm, was treated with Pepcid, PPI for short well without significant improvement  Dr. Jannifer Franklin prescribed clonazepam 0.5 mg daily, patient reported helpful, but lost follow-up  She was seen by [facility] system neurologist Dr. Manuella Ghazi, most recent visit November 2022, he will continue on her clonazepam refill, reviewed controlled substance registry, most recent refill was in March 2023, 0.5 mg 90 tablets  She was also given a diagnosis of anxiety, depression, basilar migraine  She had long history of frequent headaches, 2-3 times each week, smell bright light weather changes triggered, pounding headache, with confusion, word finding difficulties, sleepiness light sensitivity, movement make it worse, Maxalt as needed was helpful  In addition she complains of loud snoring, frequent awakening at nighttime, husband reported gasping for air episode, excessive daytime sleepiness, fatigue  Personally reviewed MRI of the brain without contrast November 2021 chronic cerebral microhemorrhage in the right cerebellum, left parietal and left frontal, suspicious for vascular malformation   PHYSICAL EXAM:   There were no vitals filed for this visit.  Not recorded     There is no height or weight on file to calculate BMI.  PHYSICAL EXAMNIATION:  Gen: NAD, conversant, well nourised, well groomed                     Cardiovascular: Regular rate rhythm, no peripheral edema, warm, nontender. Eyes: Conjunctivae  clear without exudates or hemorrhage Neck: Supple, no carotid bruits. Pulmonary: Clear to auscultation bilaterally   NEUROLOGICAL EXAM:  MENTAL STATUS: Speech/cognition: Awake, alert, oriented to history taking and  casual conversation CRANIAL NERVES: CN II: Visual fields are full to confrontation. Pupils are round equal and briskly reactive to light. CN III, IV, VI: extraocular movement are normal. No ptosis. CN V: Facial sensation is intact to light touch CN VII: Face is symmetric with normal eye closure  CN VIII: Hearing is normal to causal conversation. CN IX, X: Phonation is normal.  Normal gag reflex, drapery of soft palate CN XI: Head turning and shoulder shrug are intact CN XII: Narrow long pharyngeal space, tongue midline,  MOTOR: There is no pronator drift of out-stretched arms. Muscle bulk and tone are normal. Muscle strength is normal.  REFLEXES: Reflexes are 2+ and symmetric at the biceps, triceps, knees, and ankles. Plantar responses are flexor.  SENSORY: Intact to light touch, pinprick and vibratory sensation are intact in fingers and toes.  COORDINATION: There is no trunk or limb dysmetria noted.  GAIT/STANCE: Posture is normal. Gait is steady with normal steps, base, arm swing, and turning. Heel and toe walking are normal. Tandem gait is normal.  Romberg is absent.  REVIEW OF SYSTEMS:  Full 14 system review of systems performed and notable only for as above All other review of systems were negative.   ALLERGIES: Allergies  Allergen Reactions   Latex Other (See Comments)    VAGINAL AREA - BURNING AND ITCHING    HOME MEDICATIONS: Current Outpatient Medications  Medication Sig Dispense Refill   buPROPion (WELLBUTRIN XL) 300 MG 24 hr tablet Take 1 tablet (300 mg total) by mouth daily. 30 tablet 11   cholecalciferol (VITAMIN D3) 25 MCG (1000 UNIT) tablet Take 2 tablets (2,000 Units total) by mouth daily. (Patient taking differently: Take 3,000 Units by mouth daily.) 180 tablet 3   clonazePAM (KLONOPIN) 0.5 MG tablet Take 1 tablet (0.5 mg total) by mouth 3 (three) times daily as needed for anxiety. (Patient not taking: Reported on 12/28/2021) 90 tablet 3   desonide  (DESOWEN) 0.05 % ointment      famotidine (PEPCID) 40 MG tablet Take 1 tablet (40 mg total) by mouth daily. (Patient not taking: Reported on 12/28/2021) 90 tablet 3   fluticasone (FLONASE) 50 MCG/ACT nasal spray Place 2 sprays into both nostrils daily. 16 g 5   ketoconazole (NIZORAL) 2 % cream Apply 1 Application topically as needed.     rizatriptan (MAXALT-MLT) 10 MG disintegrating tablet Take 1 tab at onset of migraine.  May repeat in 2 hrs, if needed.  Max dose: 2 tabs/day. This is a 30 day prescription. 10 tablet 11   topiramate (TOPAMAX) 100 MG tablet Take 2 tablets (200 mg total) by mouth at bedtime. 60 tablet 11   No current facility-administered medications for this visit.    PAST MEDICAL HISTORY: Past Medical History:  Diagnosis Date   Allergy    Asthma    Eczema    GERD (gastroesophageal reflux disease)    Migraines    in past, but not currently 12/2017    PAST SURGICAL HISTORY: Past Surgical History:  Procedure Laterality Date   FOOT SURGERY     left   HERNIA REPAIR     umbilicus   TUBAL LIGATION      FAMILY HISTORY: Family History  Problem Relation Age of Onset   Cancer Mother 50       breast  HIV/AIDS Father    Cancer Maternal Grandmother        bone cancer   Stroke Maternal Grandmother    Cancer Maternal Grandfather        prostate cancer   ADD / ADHD Son    ADD / ADHD Daughter    Diabetes Neg Hx    Heart disease Neg Hx     SOCIAL HISTORY: Social History   Socioeconomic History   Marital status: Married    Spouse name: Gwyndolyn Saxon   Number of children: Not on file   Years of education: Not on file   Highest education level: Not on file  Occupational History   Occupation: accountant  Tobacco Use   Smoking status: Never   Smokeless tobacco: Never  Vaping Use   Vaping Use: Never used  Substance and Sexual Activity   Alcohol use: Yes    Alcohol/week: 2.0 standard drinks of alcohol    Types: 2 Shots of liquor per week   Drug use: No   Sexual  activity: Yes    Birth control/protection: Surgical  Other Topics Concern   Not on file  Social History Narrative   Lives with husband and 2 children, 1 dog   Right Handed   Drinks2-3 cups caffeine daily   Currently not working, homemaker   04/2020   Social Determinants of Health   Financial Resource Strain: Not on file  Food Insecurity: Not on file  Transportation Needs: Not on file  Physical Activity: Not on file  Stress: Not on file  Social Connections: Not on file  Intimate Partner Violence: Not on file      I spent *** minutes of face-to-face and non-face-to-face time with patient.  This included previsit chart review, lab review, study review, order entry, electronic health record documentation, patient education and discussion regarding above diagnoses and treatment plan and answered all other questions to patients satisfaction   Frann Rider, [facility]  [facility] [address] [place], [place] [address]  Phone [phone] Fax [phone] Note: This document was prepared with digital dictation and possible smart phrase technology. Any transcriptional errors that result from this process are unintentional.

## 2022-04-05 ENCOUNTER — Ambulatory Visit: Payer: BC Managed Care – PPO | Admitting: Adult Health

## 2022-04-05 ENCOUNTER — Encounter: Payer: Self-pay | Admitting: Adult Health

## 2022-04-05 VITALS — BP 140/89 | HR 111 | Ht 69.0 in | Wt 214.0 lb

## 2022-04-05 DIAGNOSIS — G43709 Chronic migraine without aura, not intractable, without status migrainosus: Secondary | ICD-10-CM | POA: Diagnosis not present

## 2022-04-05 DIAGNOSIS — R600 Localized edema: Secondary | ICD-10-CM

## 2022-04-05 MED ORDER — TOPIRAMATE ER 200 MG PO CAP24: 200.0000 mg | ORAL_CAPSULE | Freq: Every evening | ORAL | 5 refills | Status: AC

## 2022-04-05 NOTE — Patient Instructions (Addendum)
Your Plan:  Recommend switching to Trokendi (extended release form of topiramate)   Continue maxalt as needed  New order placed to complete MRI brain - you will be called to schedule  We will repeat lab work today - will refer to rheumatologist based on these results      Follow up with Dr. Krista Blue in 4-5 months or call earlier if needed      Thank you for coming to see [place] at [facility]. I hope we have been able to provide you high quality care today.  You may receive a patient satisfaction survey over the next few weeks. We would appreciate your feedback and comments so that we may continue to improve ourselves and the health of our patients.

## 2022-04-06 ENCOUNTER — Other Ambulatory Visit: Payer: Self-pay | Admitting: Adult Health

## 2022-04-06 ENCOUNTER — Telehealth: Payer: Self-pay | Admitting: Adult Health

## 2022-04-06 DIAGNOSIS — G43709 Chronic migraine without aura, not intractable, without status migrainosus: Secondary | ICD-10-CM

## 2022-04-06 DIAGNOSIS — G4719 Other hypersomnia: Secondary | ICD-10-CM

## 2022-04-06 DIAGNOSIS — R600 Localized edema: Secondary | ICD-10-CM

## 2022-04-06 DIAGNOSIS — R768 Other specified abnormal immunological findings in serum: Secondary | ICD-10-CM

## 2022-04-06 LAB — ANA W/REFLEX IF POSITIVE
Anti JO-1: <0.2 AI (ref 0.0–0.9)
Anti Nuclear Antibody (ANA): POSITIVE — AB
Centromere Ab Screen: <0.2 AI (ref 0.0–0.9)
Chromatin Ab SerPl-aCnc: <0.2 AI (ref 0.0–0.9)
ENA RNP Ab: 0.2 AI (ref 0.0–0.9)
ENA SM Ab Ser-aCnc: <0.2 AI (ref 0.0–0.9)
ENA SSA (RO) Ab: <0.2 AI (ref 0.0–0.9)
ENA SSB (LA) Ab: 2 AI — ABNORMAL HIGH (ref 0.0–0.9)
Scleroderma (Scl-70) (ENA) Antibody, IgG: <0.2 AI (ref 0.0–0.9)
dsDNA Ab: <1 IU/mL (ref 0–9)

## 2022-04-06 NOTE — Telephone Encounter (Signed)
Referral for Rheumatology fax to [facility]. Phone:[phone], Fax: [phone].

## 2022-04-07 ENCOUNTER — Encounter: Payer: Self-pay | Admitting: Adult Health

## 2022-04-12 ENCOUNTER — Telehealth: Payer: Self-pay | Admitting: Adult Health

## 2022-04-12 NOTE — Telephone Encounter (Signed)
BCBS Josem Kaufmann: [id number] exp. 04/11/22-05/10/22 sent to [facility]

## 2022-04-25 ENCOUNTER — Telehealth: Payer: Self-pay | Admitting: Neurology

## 2022-04-25 ENCOUNTER — Encounter: Payer: Self-pay | Admitting: Neurology

## 2022-04-25 NOTE — Telephone Encounter (Signed)
Reviewed MRI report which showed stable findings compared to prior imaging (posterior left frontal lobe, medial left parietal lobe and right paravermian region chronic microhemorrhages and developmental venous anomaly in the upper right basal ganglia and right corona radiata).  No new abnormalities or concerning findings since prior imaging in 12/2019.  No further imaging/workup recommended from our standpoint.

## 2022-04-25 NOTE — Telephone Encounter (Signed)
Received the impression report where the patient completed MRI brain with/without contrast.

## 2022-06-01 ENCOUNTER — Other Ambulatory Visit (HOSPITAL_COMMUNITY): Payer: Self-pay

## 2022-06-01 DIAGNOSIS — Z8616 Personal history of COVID-19: Secondary | ICD-10-CM

## 2022-06-22 ENCOUNTER — Telehealth: Payer: Self-pay | Admitting: Neurology

## 2022-06-22 NOTE — Telephone Encounter (Signed)
LVM and sent mychart msg informing pt of need to reschedule 08/30/22 appointment - MD out 

## 2022-07-12 ENCOUNTER — Telehealth (HOSPITAL_COMMUNITY): Payer: Self-pay | Admitting: Vascular Surgery

## 2022-07-12 NOTE — Telephone Encounter (Signed)
Lvm giving pt pft and new PHT appt w/ AD 6/12, ASKED PT TO CALL BACK TO CONFIRM, WILL SEND NEW PT PACKET

## 2022-07-20 ENCOUNTER — Encounter (HOSPITAL_COMMUNITY): Payer: Self-pay | Admitting: Cardiology

## 2022-07-20 ENCOUNTER — Inpatient Hospital Stay (HOSPITAL_COMMUNITY): Admission: RE | Admit: 2022-07-20 | Payer: Self-pay | Source: Ambulatory Visit

## 2022-08-30 ENCOUNTER — Ambulatory Visit: Payer: BC Managed Care – PPO | Admitting: Neurology

## 2023-03-11 ENCOUNTER — Other Ambulatory Visit: Payer: Self-pay | Admitting: Neurology
